# Patient Record
Sex: Female | Born: 1995
Health system: Southern US, Community
[De-identification: ages and names within clinical notes are randomized; demographics above are authoritative.]

## PROBLEM LIST (undated history)

## (undated) ENCOUNTER — Inpatient Hospital Stay (HOSPITAL_COMMUNITY): Payer: Self-pay

## (undated) DIAGNOSIS — K831 Obstruction of bile duct: Secondary | ICD-10-CM

## (undated) DIAGNOSIS — Z789 Other specified health status: Secondary | ICD-10-CM

## (undated) DIAGNOSIS — O26649 Intrahepatic cholestasis of pregnancy, unspecified trimester: Secondary | ICD-10-CM

## (undated) DIAGNOSIS — E282 Polycystic ovarian syndrome: Secondary | ICD-10-CM

## (undated) DIAGNOSIS — O26619 Liver and biliary tract disorders in pregnancy, unspecified trimester: Secondary | ICD-10-CM

## (undated) DIAGNOSIS — K5900 Constipation, unspecified: Secondary | ICD-10-CM

## (undated) HISTORY — DX: Intrahepatic cholestasis of pregnancy, unspecified trimester: O26.649

## (undated) HISTORY — DX: Polycystic ovarian syndrome: E28.2

## (undated) HISTORY — DX: Obstruction of bile duct: K83.1

## (undated) HISTORY — DX: Constipation, unspecified: K59.00

## (undated) HISTORY — DX: Liver and biliary tract disorders in pregnancy, unspecified trimester: O26.619

---
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
## 2013-10-13 HISTORY — PX: CHOLECYSTECTOMY: SHX55

## 2013-11-30 ENCOUNTER — Telehealth: Payer: Self-pay | Admitting: Family Medicine

## 2013-12-01 NOTE — Telephone Encounter (Signed)
Spoke with pt regarding appt Back pain is resolved Will call back if NTBS

## 2015-08-17 ENCOUNTER — Inpatient Hospital Stay (HOSPITAL_COMMUNITY)
Admission: AD | Admit: 2015-08-17 | Discharge: 2015-08-17 | Disposition: A | Discharge status: Home / Self Care | Payer: Medicaid Other | Source: Ambulatory Visit | Attending: Obstetrics and Gynecology | Admitting: Obstetrics and Gynecology

## 2015-08-17 ENCOUNTER — Other Ambulatory Visit: Payer: Self-pay | Admitting: Obstetrics and Gynecology

## 2015-08-17 ENCOUNTER — Encounter (HOSPITAL_COMMUNITY): Payer: Self-pay | Admitting: *Deleted

## 2015-08-17 DIAGNOSIS — O99612 Diseases of the digestive system complicating pregnancy, second trimester: Secondary | ICD-10-CM | POA: Diagnosis not present

## 2015-08-17 DIAGNOSIS — R0602 Shortness of breath: Secondary | ICD-10-CM | POA: Diagnosis not present

## 2015-08-17 DIAGNOSIS — R079 Chest pain, unspecified: Secondary | ICD-10-CM | POA: Diagnosis not present

## 2015-08-17 DIAGNOSIS — O26892 Other specified pregnancy related conditions, second trimester: Secondary | ICD-10-CM | POA: Diagnosis present

## 2015-08-17 DIAGNOSIS — Z3A24 24 weeks gestation of pregnancy: Secondary | ICD-10-CM | POA: Diagnosis not present

## 2015-08-17 DIAGNOSIS — K219 Gastro-esophageal reflux disease without esophagitis: Secondary | ICD-10-CM

## 2015-08-17 HISTORY — DX: Other specified health status: Z78.9

## 2015-08-17 NOTE — MAU Note (Signed)
L side chest pain for the past 1-2 weeks, also having SOB & dizziness, extremely tired, nauseated @ times.   Sent by MD office to MAU.  Pt states she has heavy discharge, no bleeding or LOF.

## 2015-08-17 NOTE — MAU Provider Note (Signed)
  History     CSN: [id number]  Arrival date and time: 08/17/15 1701   First Provider Initiated Contact with Patient 08/17/15 1809      Chief Complaint  Patient presents with  . Chest Pain  . Shortness of Breath   HPI [name] is a 19 y.o. G1P0 at 4869w6d who presents to MAU today with complaint of SOB. The patient states that this has been present x 1 week. She denies URI symptoms, chest pain, abdominal pain, vaginal bleeding or LOF.   OB History    Gravida Para Term Preterm AB TAB SAB Ectopic Multiple Living   1               Past Medical History  Diagnosis Date  . Medical history non-contributory     Past Surgical History  Procedure Laterality Date  . Cholecystectomy  2015    History reviewed. No pertinent family history.  Social History  Substance Use Topics  . Smoking status: Never Smoker   . Smokeless tobacco: None  . Alcohol Use: No    Allergies:  Allergies  Allergen Reactions  . Morphine And Related Itching    Prescriptions prior to admission  Medication Sig Dispense Refill Last Dose  . Prenatal Vit-Fe Fumarate-FA (PRENATAL MULTIVITAMIN) TABS tablet Take 1 tablet by mouth daily at 12 noon.   08/17/2015 at Unknown time    Review of Systems  Constitutional: Negative for fever and malaise/fatigue.  Respiratory: Positive for shortness of breath.   Cardiovascular: Negative for chest pain.  Gastrointestinal: Negative for abdominal pain.  Genitourinary:       Neg -vaginal bleeding, discharge, LOF   Physical Exam   Blood pressure 116/66, pulse 85, temperature 98.4 F (36.9 C), temperature source Oral, resp. rate 18, height 5\' 3"  (1.6 m), weight 185 lb (83.915 kg), SpO2 100 %.  Physical Exam  Nursing note and vitals reviewed. Constitutional: She is oriented to person, place, and time. She appears well-developed and well-nourished. No distress.  HENT:  Head: Normocephalic and atraumatic.  Cardiovascular: Normal rate, regular rhythm and normal  heart sounds.   Respiratory: Effort normal and breath sounds normal. No respiratory distress.  GI: Soft. She exhibits no distension and no mass. There is no tenderness. There is no rebound and no guarding.  Neurological: She is alert and oriented to person, place, and time.  Skin: Skin is warm and dry. No erythema.  Psychiatric: She has a normal mood and affect.    Fetal Monitoring: Baseline: 130 bpm, moderate variability, + accelerations, no decelerations Contractions: none  MAU Course  Procedures NOne  MDM EKG - Normal sinus rhythm Discussed patient with Dr. Henderson CloudHorvath. Ok for discharge at this time with GERD instructions. Patient may follow-up as scheduled or sooner.    Assessment and Plan  A: SIUP at 1469w6d SOB GERD  P: Discharge home GERD diet discussed Patient advised [facility] scheduled or sooner PRN Patient may return to MAU as needed or if her condition were to change or worsen   Marny LowensteinJulie N Wenzel, PA-C  08/17/2015, 6:09 PM

## 2015-08-17 NOTE — Discharge Instructions (Signed)
Food Choices for Gastroesophageal Reflux Disease, Adult  When you have gastroesophageal reflux disease (GERD), the foods you eat and your eating habits are very important. Choosing the right foods can help ease your discomfort.   WHAT GUIDELINES DO I NEED TO FOLLOW?   · Choose fruits, vegetables, whole grains, and low-fat dairy products.    · Choose low-fat meat, fish, and poultry.  · Limit fats such as oils, salad dressings, butter, nuts, and avocado.    · Keep a food diary. This helps you identify foods that cause symptoms.    · Avoid foods that cause symptoms. These may be different for everyone.    · Eat small meals often instead of 3 large meals a day.    · Eat your meals slowly, in a place where you are relaxed.    · Limit fried foods.    · Cook foods using methods other than frying.    · Avoid drinking alcohol.    · Avoid drinking large amounts of liquids with your meals.    · Avoid bending over or lying down until 2-3 hours after eating.    WHAT FOODS ARE NOT RECOMMENDED?   These are some foods and drinks that may make your symptoms worse:  Vegetables  Tomatoes. Tomato juice. Tomato and spaghetti sauce. Chili peppers. Onion and garlic. Horseradish.  Fruits  Oranges, grapefruit, and lemon (fruit and juice).  Meats  High-fat meats, fish, and poultry. This includes hot dogs, ribs, ham, sausage, salami, and bacon.  Dairy  Whole milk and chocolate milk. Sour cream. Cream. Butter. Ice cream. Cream cheese.   Drinks  Coffee and tea. Bubbly (carbonated) drinks or energy drinks.  Condiments  Hot sauce. Barbecue sauce.   Sweets/Desserts  Chocolate and cocoa. Donuts. Peppermint and spearmint.  Fats and Oils  High-fat foods. This includes French fries and potato chips.  Other  Vinegar. Strong spices. This includes black pepper, white pepper, red pepper, cayenne, curry powder, cloves, ginger, and chili powder.  The items listed above may not be a complete list of foods and drinks to avoid. Contact your dietitian for more  information.     This information is not intended to replace advice given to you by your health care provider. Make sure you discuss any questions you have with your health care provider.     Document Released: 03/30/2012 Document Revised: 10/20/2014 Document Reviewed: 08/03/2013  Elsevier Interactive Patient Education ©2016 Elsevier Inc.

## 2015-10-14 NOTE — L&D Delivery Note (Signed)
Delivery Note At 12:20 PM a viable female was delivered via  (Presentation vertex: ; LOA ).  APGAR:8 ,9 ; weight  .   Placenta status:spont , via shultz.  Cord:3vc  with the following complications:none .  Cord pH: n/a  Anesthesia: epidural  Episiotomy:  none Lacerations:  none Suture Repair: none Est. Blood Loss 100(mL):    Mom to postpartum.  Baby to Couplet care / Skin to Skin.  Samier Jaco DARLENE 12/02/2015, 12:30 PM

## 2015-11-01 LAB — OB RESULTS CONSOLE GBS: STREP GROUP B AG: NEGATIVE

## 2015-11-11 ENCOUNTER — Encounter (HOSPITAL_COMMUNITY): Payer: Self-pay

## 2015-11-11 ENCOUNTER — Inpatient Hospital Stay (HOSPITAL_COMMUNITY)
Admission: AD | Admit: 2015-11-11 | Discharge: 2015-11-11 | Disposition: A | Discharge status: Home / Self Care | Payer: Medicaid Other | Source: Ambulatory Visit | Attending: Obstetrics and Gynecology | Admitting: Obstetrics and Gynecology

## 2015-11-11 DIAGNOSIS — Z3493 Encounter for supervision of normal pregnancy, unspecified, third trimester: Secondary | ICD-10-CM | POA: Diagnosis not present

## 2015-11-11 NOTE — Progress Notes (Signed)
May D/C home 

## 2015-11-11 NOTE — MAU Note (Signed)
Pt states contractions that started around 630p and are every 5 mins apart. Denies LOF, or vag bleeding. +FM. Was 4cm at last exam in office.

## 2015-11-19 ENCOUNTER — Ambulatory Visit (INDEPENDENT_AMBULATORY_CARE_PROVIDER_SITE_OTHER): Payer: Medicaid Other | Admitting: Family Medicine

## 2015-11-19 ENCOUNTER — Encounter: Payer: Self-pay | Admitting: Family Medicine

## 2015-11-19 VITALS — BP 126/83 | HR 106 | Temp 98.3°F | Ht 63.0 in | Wt 209.4 lb

## 2015-11-19 DIAGNOSIS — H66002 Acute suppurative otitis media without spontaneous rupture of ear drum, left ear: Secondary | ICD-10-CM

## 2015-11-19 DIAGNOSIS — R52 Pain, unspecified: Secondary | ICD-10-CM | POA: Diagnosis not present

## 2015-11-19 DIAGNOSIS — R059 Cough, unspecified: Secondary | ICD-10-CM

## 2015-11-19 DIAGNOSIS — J029 Acute pharyngitis, unspecified: Secondary | ICD-10-CM

## 2015-11-19 DIAGNOSIS — R05 Cough: Secondary | ICD-10-CM

## 2015-11-19 LAB — POCT INFLUENZA A/B
INFLUENZA A, POC: NEGATIVE
Influenza B, POC: NEGATIVE

## 2015-11-19 LAB — POCT RAPID STREP A (OFFICE): Rapid Strep A Screen: NEGATIVE

## 2015-11-19 MED ORDER — NYSTATIN 100000 UNIT/GM EX CREA: 1.0000 "application " | TOPICAL_CREAM | Freq: Two times a day (BID) | CUTANEOUS | Status: DC

## 2015-11-19 MED ORDER — AMOXICILLIN 500 MG PO CAPS: 500.0000 mg | ORAL_CAPSULE | Freq: Two times a day (BID) | ORAL | Status: DC

## 2015-11-19 NOTE — Progress Notes (Signed)
   HPI  Patient presents today here to establish care and discuss an acute illness.  Patient is [redacted] weeks pregnant and goes to [facility]  She describes 3 days of body aches, cough, nausea, nasal congestion, and sore throat. She also has left-sided ear pain with muffled hearing. She also has body aches, chills, and has had a few episodes of diarrhea. She's also had nausea, with one episode of post tussive emesis this morning, she's had 2-3 episodes of loose stools today. She is GBS negative.  She has normal fetal movement, no vaginal bleeding or discharge, and no contractions currently. She has had one or 2 irregular contractions in the last 2 days. She denies headache.  She also complains of intense itching of the bilateral palms and soles over the last week or so.   PMH: Smoking status noted Past medical, surgical, social, family history reviewed and updated in EMR ROS: Per HPI  Objective: BP 126/83 mmHg  Pulse 106  Temp(Src) 98.3 F (36.8 C) (Oral)  Ht  (1.6 m)  Wt 209 lb 6.4 oz (94.983 kg)  BMI 37.10 kg/m2 Gen: NAD, alert, cooperative with exam HEENT: NCAT CV: RRR, good S1/S2, no murmur Resp: CTABL, no wheezes, non-labored Abd: gravid uterus, skin with mild intertrigo under abdominal fold Ext: No edema, warm Neuro: Alert and oriented, No gross deficits  Assessment and plan:  # Acute suppurative otitis media Treat with amoxil, superimposed on viral illness  # Intertrigo Treat with nystatin  # Itching of the palms and soles Concerning for cholestasis of pregnancy, no jaundice or icterus Abd with rash of the skin fold that is consistent with intertrigo Discussed with her GYN, Dr. Claiborne Billings who offered labs tomorrow or on Wednesday when she comes in for routine visit Discussed kick counts   Orders Placed This Encounter  Procedures  . Culture, Group A Strep    Order Specific Question:  Source    Answer:  throat  . POCT rapid strep A  . POCT  Influenza A/B     Murtis Sink, MD [facility] 11/19/2015, 3:20 PM

## 2015-11-19 NOTE — Patient Instructions (Signed)
Great ti see you!  We will talk to your GYN doctor and I will talk with you when you come back with your brother.   Otitis Media With Effusion Otitis media with effusion is the presence of fluid in the middle ear. This is a common problem in children, which often follows ear infections. It may be present for weeks or longer after the infection. Unlike an acute ear infection, otitis media with effusion refers only to fluid behind the ear drum and not infection. Children with repeated ear and sinus infections and allergy problems are the most likely to get otitis media with effusion. CAUSES  The most frequent cause of the fluid buildup is dysfunction of the eustachian tubes. These are the tubes that drain fluid in the ears to the back of the nose (nasopharynx). SYMPTOMS   The main symptom of this condition is hearing loss. As a result, you or your child may:  Listen to the TV at a loud volume.  Not respond to questions.  Ask "what" often when spoken to.  Mistake or confuse one sound or word for another.  There may be a sensation of fullness or pressure but usually not pain. DIAGNOSIS   Your health care provider will diagnose this condition by examining you or your child's ears.  Your health care provider may test the pressure in you or your child's ear with a tympanometer.  A hearing test may be conducted if the problem persists. TREATMENT   Treatment depends on the duration and the effects of the effusion.  Antibiotics, decongestants, nose drops, and cortisone-type drugs (tablets or nasal spray) may not be helpful.  Children with persistent ear effusions may have delayed language or behavioral problems. Children at risk for developmental delays in hearing, learning, and speech may require referral to a specialist earlier than children not at risk.  You or your child's health care provider may suggest a referral to an ear, nose, and throat surgeon for treatment. The following may  help restore normal hearing:  Drainage of fluid.  Placement of ear tubes (tympanostomy tubes).  Removal of adenoids (adenoidectomy). HOME CARE INSTRUCTIONS   Avoid secondhand smoke.  Infants who are breastfed are less likely to have this condition.  Avoid feeding infants while they are lying flat.  Avoid known environmental allergens.  Avoid people who are sick. SEEK MEDICAL CARE IF:   Hearing is not better in 3 months.  Hearing is worse.  Ear pain.  Drainage from the ear.  Dizziness. MAKE SURE YOU:   Understand these instructions.  Will watch your condition.  Will get help right away if you are not doing well or get worse.   This information is not intended to replace advice given to you by your health care provider. Make sure you discuss any questions you have with your health care provider.   Document Released: 11/06/2004 Document Revised: 10/20/2014 Document Reviewed: 04/26/2013 Elsevier Interactive Patient Education [employer]

## 2015-11-20 ENCOUNTER — Telehealth: Payer: Self-pay | Admitting: Family Medicine

## 2015-11-20 ENCOUNTER — Ambulatory Visit: Payer: Medicaid Other | Admitting: Family Medicine

## 2015-11-20 ENCOUNTER — Inpatient Hospital Stay (HOSPITAL_COMMUNITY)
Admission: AD | Admit: 2015-11-20 | Discharge: 2015-11-20 | Disposition: A | Discharge status: Home / Self Care | Payer: Medicaid Other | Source: Ambulatory Visit | Attending: Obstetrics and Gynecology | Admitting: Obstetrics and Gynecology

## 2015-11-20 ENCOUNTER — Encounter (HOSPITAL_COMMUNITY): Payer: Self-pay

## 2015-11-20 DIAGNOSIS — Z9049 Acquired absence of other specified parts of digestive tract: Secondary | ICD-10-CM | POA: Diagnosis not present

## 2015-11-20 DIAGNOSIS — R509 Fever, unspecified: Secondary | ICD-10-CM | POA: Diagnosis present

## 2015-11-20 DIAGNOSIS — O98813 Other maternal infectious and parasitic diseases complicating pregnancy, third trimester: Secondary | ICD-10-CM | POA: Diagnosis not present

## 2015-11-20 DIAGNOSIS — J101 Influenza due to other identified influenza virus with other respiratory manifestations: Secondary | ICD-10-CM | POA: Diagnosis not present

## 2015-11-20 DIAGNOSIS — K219 Gastro-esophageal reflux disease without esophagitis: Secondary | ICD-10-CM | POA: Diagnosis not present

## 2015-11-20 DIAGNOSIS — Z3A38 38 weeks gestation of pregnancy: Secondary | ICD-10-CM | POA: Diagnosis not present

## 2015-11-20 DIAGNOSIS — J111 Influenza due to unidentified influenza virus with other respiratory manifestations: Secondary | ICD-10-CM | POA: Diagnosis not present

## 2015-11-20 DIAGNOSIS — O9989 Other specified diseases and conditions complicating pregnancy, childbirth and the puerperium: Secondary | ICD-10-CM

## 2015-11-20 DIAGNOSIS — J029 Acute pharyngitis, unspecified: Secondary | ICD-10-CM | POA: Diagnosis present

## 2015-11-20 LAB — URINE MICROSCOPIC-ADD ON

## 2015-11-20 LAB — URINALYSIS, ROUTINE W REFLEX MICROSCOPIC
Bilirubin Urine: NEGATIVE
Glucose, UA: NEGATIVE mg/dL
Ketones, ur: NEGATIVE mg/dL
Nitrite: NEGATIVE
Protein, ur: NEGATIVE mg/dL
Specific Gravity, Urine: <1.005 — ABNORMAL LOW (ref 1.005–1.030)
pH: 6.5 (ref 5.0–8.0)

## 2015-11-20 LAB — INFLUENZA PANEL BY PCR (TYPE A & B)
H1N1 flu by pcr: NOT DETECTED
Influenza A By PCR: POSITIVE — AB
Influenza B By PCR: NEGATIVE

## 2015-11-20 MED ORDER — PROMETHAZINE HCL 25 MG/ML IJ SOLN
25.0000 mg | Freq: Once | INTRAMUSCULAR | Status: AC
  Administered 2015-11-20: 25 mg via INTRAVENOUS
  Filled 2015-11-20: qty 1

## 2015-11-20 MED ORDER — OSELTAMIVIR PHOSPHATE 75 MG PO CAPS: 75.0000 mg | ORAL_CAPSULE | Freq: Two times a day (BID) | ORAL | Status: AC

## 2015-11-20 MED ORDER — LACTATED RINGERS IV BOLUS (SEPSIS)
1000.0000 mL | Freq: Once | INTRAVENOUS | Status: AC
  Administered 2015-11-20: 1000 mL via INTRAVENOUS

## 2015-11-20 MED ORDER — ACETAMINOPHEN 325 MG PO TABS
650.0000 mg | ORAL_TABLET | Freq: Four times a day (QID) | ORAL | Status: DC | PRN
  Administered 2015-11-20: 650 mg via ORAL
  Filled 2015-11-20: qty 2

## 2015-11-20 MED ORDER — BENZONATATE 100 MG PO CAPS: 100.0000 mg | ORAL_CAPSULE | Freq: Three times a day (TID) | ORAL | Status: DC | PRN

## 2015-11-20 NOTE — Telephone Encounter (Signed)
Pt seen yesterday for illness Fever started last PM Pt wants to be rechecked appt scheduled

## 2015-11-20 NOTE — MAU Provider Note (Signed)
History     CSN: [id number]  Arrival date and time: 11/20/15 1128     Chief Complaint  Patient presents with  . Sore Throat  . Fever  . Diarrhea  . Nasal Congestion   HPI  Christine Alexander is a 20 yo G1 at [redacted]w[redacted]d presenting with rhinorrhea, sore throat, diarrhea, cough and fever. She reports body aches, rhinorrhea, sore throat, chills at night, throbbing frontal HA for the past 4 days. Has been treating herself with Tylenol and Robitussin which initially helped but has not been helping for the day or so. She was seen by [facility] 2/6 where she was diagnosed with suppurative otitis media and was given amoxicillin. Additionally her rapid strep and flu were negative. Overnight, symptoms worsened. She also reports productive yellow-green sputum with post-tussive gagging but no emesis, increased shortness of breath since yesterday, mid chest pain with inspiration. Notes of watery diarrhea for 1 day; no blood in diarrhea. She reported of fever of 101F this AM; she called her PCP who encouraged her to been seen. Patient reports sick contact; her brother who was recently diagnosed with PNA and her other brother with symptoms of URI with cough and fever. Patient reports decreased appetite since symptoms worsened. She has been drinking water, but not able to each much solids she has the sensation that the food comes back up; she attributes this to her GERD.   She reports no contractions, no VB, no LOF. Endorses fetal movement. Reports no complications with current pregnancy  Past Medical History  Diagnosis Date  . Medical history non-contributory     Past Surgical History  Procedure Laterality Date  . Cholecystectomy  2015    Family History  Problem Relation Age of Onset  . Hypertension Father     Social History  Substance Use Topics  . Smoking status: Never Smoker   . Smokeless tobacco: None  . Alcohol Use: No    Allergies:  Allergies  Allergen Reactions  . Morphine And Related Itching     Prescriptions prior to admission  Medication Sig Dispense Refill Last Dose  . amoxicillin (AMOXIL) 500 MG capsule Take 1 capsule (500 mg total) by mouth 2 (two) times daily. 20 capsule 0   . nystatin cream (MYCOSTATIN) Apply 1 application topically 2 (two) times daily. 30 g 0   . Prenatal Vit-Fe Fumarate-FA (PRENATAL MULTIVITAMIN) TABS tablet Take 1 tablet by mouth daily at 12 noon.   Taking    Review of Systems  Constitutional: Positive for fever, chills and malaise/fatigue.  HENT: Positive for congestion, ear pain and sore throat. Negative for ear discharge.   Respiratory: Positive for cough, sputum production and shortness of breath. Negative for wheezing.   Cardiovascular: Negative for chest pain and palpitations.  Gastrointestinal: Positive for nausea, vomiting, abdominal pain and diarrhea.  Genitourinary: Negative for dysuria and urgency.  Neurological: Positive for headaches.   Physical Exam   Blood pressure 125/74, pulse 102, temperature 98.3 F (36.8 C), temperature source Oral, height  (1.6 m), weight 94.348 kg (208 lb), SpO2 99 %.  Physical Exam  Constitutional: She appears well-developed and well-nourished. No distress.  HENT:  Head: Normocephalic and atraumatic.  Right Ear: External ear normal. Tympanic membrane is not erythematous, not retracted and not bulging.  Left Ear: External ear normal. Tympanic membrane is not erythematous, not retracted and not bulging.  Nose: Rhinorrhea (Turbinates erythematous and edematous) present. Right sinus exhibits maxillary sinus tenderness and frontal sinus tenderness. Left sinus exhibits maxillary sinus  tenderness and frontal sinus tenderness.  Mouth/Throat: Posterior oropharyngeal edema and posterior oropharyngeal erythema present. No oropharyngeal exudate.  Eyes: Pupils are equal, round, and reactive to light.  Cardiovascular: Regular rhythm, S1 normal and S2 normal.  Tachycardia present.   Respiratory: Effort normal and  breath sounds normal. She has no decreased breath sounds. She has no wheezes. She has no rhonchi. She has no rales. She exhibits no tenderness.  GI: Soft. Normal appearance and bowel sounds are normal. There is no tenderness.  Lymphadenopathy:       Head (right side): Submandibular (Tenderness to palpation) adenopathy present. No tonsillar, no preauricular and no posterior auricular adenopathy present.       Head (left side): No tonsillar, no preauricular and no posterior auricular adenopathy present.       Right cervical: No superficial cervical, no deep cervical and no posterior cervical adenopathy present.      Left cervical: No superficial cervical, no deep cervical and no posterior cervical adenopathy present.  Skin: Skin is warm and dry.  Psychiatric: She has a normal mood and affect.   Results for orders placed or performed during the hospital encounter of 11/20/15 (from the past 24 hour(s))  Urinalysis, Routine w reflex microscopic (not at [facility])     Status: Abnormal   Collection Time: 11/20/15 11:45 AM  Result Value Ref Range   Color, Urine YELLOW YELLOW   APPearance CLEAR CLEAR   Specific Gravity, Urine <1.005 (L) 1.005 - 1.030   pH 6.5 5.0 - 8.0   Glucose, UA NEGATIVE NEGATIVE mg/dL   Hgb urine dipstick TRACE (A) NEGATIVE   Bilirubin Urine NEGATIVE NEGATIVE   Ketones, ur NEGATIVE NEGATIVE mg/dL   Protein, ur NEGATIVE NEGATIVE mg/dL   Nitrite NEGATIVE NEGATIVE   Leukocytes, UA SMALL (A) NEGATIVE  Urine microscopic-add on     Status: Abnormal   Collection Time: 11/20/15 11:45 AM  Result Value Ref Range   Squamous Epithelial / LPF 0-5 (A) NONE SEEN   WBC, UA 0-5 0 - 5 WBC/hpf   RBC / HPF 0-5 0 - 5 RBC/hpf   Bacteria, UA FEW (A) NONE SEEN   Positive for influenza Type A MAU Course  Procedures  MDM Pt will be treated with Tamiflu on outpatient basis. Dr Dareen Piano aware of POC. FHR Cat1 and occasional contractions  Assessment and Plan  Influenza Tamiflu  BID x 5  days Tessalon Perls   Discharge    Palma Holter 11/20/2015, 1:55 PM

## 2015-11-20 NOTE — Discharge Instructions (Signed)
Influenza, Adult °Influenza ("the flu") is a viral infection of the respiratory tract. It occurs more often in winter months because people spend more time in close contact with one another. Influenza can make you feel very sick. Influenza easily spreads from person to person (contagious). °CAUSES  °Influenza is caused by a virus that infects the respiratory tract. You can catch the virus by breathing in droplets from an infected person's cough or sneeze. You can also catch the virus by touching something that was recently contaminated with the virus and then touching your mouth, nose, or eyes. °RISKS AND COMPLICATIONS °You may be at risk for a more severe case of influenza if you smoke cigarettes, have diabetes, have chronic heart disease (such as heart failure) or lung disease (such as asthma), or if you have a weakened immune system. Elderly people and pregnant women are also at risk for more serious infections. The most common problem of influenza is a lung infection (pneumonia). Sometimes, this problem can require emergency medical care and may be life threatening. °SIGNS AND SYMPTOMS  °Symptoms typically last 4 to 10 days and may include: °· Fever. °· Chills. °· Headache, body aches, and muscle aches. °· Sore throat. °· Chest discomfort and cough. °· Poor appetite. °· Weakness or feeling tired. °· Dizziness. °· Nausea or vomiting. °DIAGNOSIS  °Diagnosis of influenza is often made based on your history and a physical exam. A nose or throat swab test can be done to confirm the diagnosis. °TREATMENT  °In mild cases, influenza goes away on its own. Treatment is directed at relieving symptoms. For more severe cases, your health care provider may prescribe antiviral medicines to shorten the sickness. Antibiotic medicines are not effective because the infection is caused by a virus, not by bacteria. °HOME CARE INSTRUCTIONS °· Take medicines only as directed by your health care provider. °· Use a cool mist humidifier  to make breathing easier. °· Get plenty of rest until your temperature returns to normal. This usually takes 3 to 4 days. °· Drink enough fluid to keep your urine clear or pale yellow. °· Cover your mouth and nose when coughing or sneezing, and wash your hands well to prevent the virus from spreading. °· Stay home from work or school until the fever is gone for at least 1 full day. °PREVENTION  °An annual influenza vaccination (flu shot) is the best way to avoid getting influenza. An annual flu shot is now routinely recommended for all adults in the [place] °SEEK MEDICAL CARE IF: °· You experience chest pain, your cough worsens, or you produce more mucus. °· You have nausea, vomiting, or diarrhea. °· Your fever returns or gets worse. °SEEK IMMEDIATE MEDICAL CARE IF: °· You have trouble breathing, you become short of breath, or your skin or nails become bluish. °· You have severe pain or stiffness in the neck. °· You develop a sudden headache, or pain in the face or ear. °· You have nausea or vomiting that you cannot control. °MAKE SURE YOU:  °· Understand these instructions. °· Will watch your condition. °· Will get help right away if you are not doing well or get worse. °  °This information is not intended to replace advice given to you by your health care provider. Make sure you discuss any questions you have with your health care provider. °  °Document Released: 09/26/2000 Document Revised: 10/20/2014 Document Reviewed: 12/29/2011 °Elsevier Interactive Patient Education ©2016 Elsevier Inc. ° °Cough, Adult °A cough helps to clear your throat and lungs. A cough may last only 2-3 weeks (acute), or it may last longer than 8 weeks (chronic). Many different things can cause a   cough. A cough may be a sign of an illness or another medical condition. °HOME CARE °· Pay attention to any changes in your cough. °· Take medicines only as told by your doctor. °¨ If you were prescribed an antibiotic medicine, take it as told by  your doctor. Do not stop taking it even if you start to feel better. °¨ Talk with your doctor before you try using a cough medicine. °· Drink enough fluid to keep your pee (urine) clear or pale yellow. °· If the air is dry, use a cold steam vaporizer or humidifier in your home. °· Stay away from things that make you cough at work or at home. °· If your cough is worse at night, try using extra pillows to raise your head up higher while you sleep. °· Do not smoke, and try not to be around smoke. If you need help quitting, ask your doctor. °· Do not have caffeine. °· Do not drink alcohol. °· Rest as needed. °GET HELP IF: °· You have new problems (symptoms). °· You cough up yellow fluid (pus). °· Your cough does not get better after 2-3 weeks, or your cough gets worse. °· Medicine does not help your cough and you are not sleeping well. °· You have pain that gets worse or pain that is not helped with medicine. °· You have a fever. °· You are losing weight and you do not know why. °· You have night sweats. °GET HELP RIGHT AWAY IF: °· You cough up blood. °· You have trouble breathing. °· Your heartbeat is very fast. °  °This information is not intended to replace advice given to you by your health care provider. Make sure you discuss any questions you have with your health care provider. °  °Document Released: 06/12/2011 Document Revised: 06/20/2015 Document Reviewed: 12/06/2014 °Elsevier Interactive Patient Education ©2016 Elsevier Inc. ° °

## 2015-11-20 NOTE — Telephone Encounter (Signed)
Called and discussed   She had a sharp decline since leaving yesterday. She states she's had fever of 101 off and on throughout the night, severe body aches, and chills. Her breathing has become labored.   She states she is so weak she can barely walk.  She declines calling an ambulance but agrees to get a ride to teh MAU at [facility] whom I've just called and explained she is coming.   She is 38 weeks, kick counts overnight have been normal but she is now worried about subjective decreased fetal movement.   She has itching of the palms and soles concerning for Cholestasis of pregnancy. She had no RUQ pain, jaundice, or icterus on my exam 1 daya go.   She has a f/u at [facility] tomorrow.    She was flu negative yesterday but I am concerned for interval development of pneumonia or a false negative flue test.    Fever of 101, Body aches, chills.

## 2015-11-20 NOTE — MAU Note (Signed)
Started 3 or 4 days ago with sore throat, headache, congestion.    Neg flu test yesterday at [facility].  Running fever 100-101.  Was up all night with diarrhea.  Her brother currently has pneumonia.  Is on an antibiotic for an ear infection-started yesterday.

## 2015-11-21 LAB — CULTURE, GROUP A STREP: STREP A CULTURE: NEGATIVE

## 2015-12-01 ENCOUNTER — Inpatient Hospital Stay (HOSPITAL_COMMUNITY)
Admission: AD | Admit: 2015-12-01 | Discharge: 2015-12-04 | DRG: 775 | Disposition: A | Discharge status: Home / Self Care | Payer: Medicaid Other | Source: Ambulatory Visit | Attending: Obstetrics and Gynecology | Admitting: Obstetrics and Gynecology

## 2015-12-01 DIAGNOSIS — Z6791 Unspecified blood type, Rh negative: Secondary | ICD-10-CM

## 2015-12-01 DIAGNOSIS — Z3A40 40 weeks gestation of pregnancy: Secondary | ICD-10-CM | POA: Diagnosis not present

## 2015-12-01 DIAGNOSIS — O26893 Other specified pregnancy related conditions, third trimester: Secondary | ICD-10-CM | POA: Diagnosis present

## 2015-12-01 DIAGNOSIS — Z3490 Encounter for supervision of normal pregnancy, unspecified, unspecified trimester: Secondary | ICD-10-CM

## 2015-12-01 NOTE — MAU Note (Signed)
Contractions since 1800. Some bloody show. Denies LOF

## 2015-12-02 ENCOUNTER — Encounter (HOSPITAL_COMMUNITY): Payer: Self-pay

## 2015-12-02 ENCOUNTER — Inpatient Hospital Stay (HOSPITAL_COMMUNITY): Payer: Medicaid Other | Admitting: Anesthesiology

## 2015-12-02 DIAGNOSIS — Z6791 Unspecified blood type, Rh negative: Secondary | ICD-10-CM

## 2015-12-02 DIAGNOSIS — Z3A40 40 weeks gestation of pregnancy: Secondary | ICD-10-CM

## 2015-12-02 DIAGNOSIS — O26893 Other specified pregnancy related conditions, third trimester: Secondary | ICD-10-CM

## 2015-12-02 DIAGNOSIS — Z3490 Encounter for supervision of normal pregnancy, unspecified, unspecified trimester: Secondary | ICD-10-CM

## 2015-12-02 LAB — CBC
HEMATOCRIT: 37.4 % (ref 36.0–46.0)
Hemoglobin: 12.9 g/dL (ref 12.0–15.0)
MCH: 29.1 pg (ref 26.0–34.0)
MCHC: 34.5 g/dL (ref 30.0–36.0)
MCV: 84.2 fL (ref 78.0–100.0)
Platelets: 228 10*3/uL (ref 150–400)
RBC: 4.44 MIL/uL (ref 3.87–5.11)
RDW: 14 % (ref 11.5–15.5)
WBC: 13.3 10*3/uL — AB (ref 4.0–10.5)

## 2015-12-02 LAB — RPR: RPR Ser Ql: NONREACTIVE

## 2015-12-02 LAB — TYPE AND SCREEN
ABO/RH(D): O NEG
ANTIBODY SCREEN: NEGATIVE

## 2015-12-02 LAB — ABO/RH: ABO/RH(D): O NEG

## 2015-12-02 MED ORDER — DIPHENHYDRAMINE HCL 50 MG/ML IJ SOLN: 12.5000 mg | INTRAMUSCULAR | Status: DC | PRN

## 2015-12-02 MED ORDER — TETANUS-DIPHTH-ACELL PERTUSSIS 5-2.5-18.5 LF-MCG/0.5 IM SUSP: 0.5000 mL | Freq: Once | INTRAMUSCULAR | Status: DC

## 2015-12-02 MED ORDER — ACETAMINOPHEN 325 MG PO TABS
650.0000 mg | ORAL_TABLET | ORAL | Status: DC | PRN
  Administered 2015-12-02: 650 mg via ORAL
  Filled 2015-12-02: qty 2

## 2015-12-02 MED ORDER — OXYTOCIN BOLUS FROM INFUSION
500.0000 mL | INTRAVENOUS | Status: DC
  Administered 2015-12-02: 500 mL via INTRAVENOUS

## 2015-12-02 MED ORDER — SIMETHICONE 80 MG PO CHEW: 80.0000 mg | CHEWABLE_TABLET | ORAL | Status: DC | PRN

## 2015-12-02 MED ORDER — DIPHENHYDRAMINE HCL 25 MG PO CAPS: 25.0000 mg | ORAL_CAPSULE | Freq: Four times a day (QID) | ORAL | Status: DC | PRN

## 2015-12-02 MED ORDER — LANOLIN HYDROUS EX OINT: TOPICAL_OINTMENT | CUTANEOUS | Status: DC | PRN

## 2015-12-02 MED ORDER — LACTATED RINGERS IV SOLN
500.0000 mL | INTRAVENOUS | Status: DC | PRN
  Administered 2015-12-02: 1000 mL via INTRAVENOUS

## 2015-12-02 MED ORDER — EPHEDRINE 5 MG/ML INJ: 10.0000 mg | INTRAVENOUS | Status: DC | PRN

## 2015-12-02 MED ORDER — FLEET ENEMA 7-19 GM/118ML RE ENEM: 1.0000 | ENEMA | RECTAL | Status: DC | PRN

## 2015-12-02 MED ORDER — FENTANYL 2.5 MCG/ML BUPIVACAINE 1/10 % EPIDURAL INFUSION (WH - ANES)
14.0000 mL/h | INTRAMUSCULAR | Status: DC | PRN
  Administered 2015-12-02: 14 mL/h via EPIDURAL
  Filled 2015-12-02: qty 125

## 2015-12-02 MED ORDER — LACTATED RINGERS IV SOLN: 500.0000 mL | Freq: Once | INTRAVENOUS | Status: AC

## 2015-12-02 MED ORDER — LIDOCAINE HCL (PF) 1 % IJ SOLN
30.0000 mL | INTRAMUSCULAR | Status: DC | PRN
  Filled 2015-12-02: qty 30

## 2015-12-02 MED ORDER — BENZOCAINE-MENTHOL 20-0.5 % EX AERO
1.0000 "application " | INHALATION_SPRAY | CUTANEOUS | Status: DC | PRN
  Administered 2015-12-02: 1 via TOPICAL
  Filled 2015-12-02: qty 56

## 2015-12-02 MED ORDER — LIDOCAINE HCL (PF) 1 % IJ SOLN
INTRAMUSCULAR | Status: DC | PRN
  Administered 2015-12-02: 4 mL
  Administered 2015-12-02: 6 mL

## 2015-12-02 MED ORDER — EPHEDRINE 5 MG/ML INJ
10.0000 mg | INTRAVENOUS | Status: DC | PRN
  Filled 2015-12-02: qty 2

## 2015-12-02 MED ORDER — IBUPROFEN 600 MG PO TABS
600.0000 mg | ORAL_TABLET | Freq: Four times a day (QID) | ORAL | Status: DC
  Administered 2015-12-03 – 2015-12-04 (×7): 600 mg via ORAL
  Filled 2015-12-02 (×8): qty 1

## 2015-12-02 MED ORDER — CITRIC ACID-SODIUM CITRATE 334-500 MG/5ML PO SOLN: 30.0000 mL | ORAL | Status: DC | PRN

## 2015-12-02 MED ORDER — PHENYLEPHRINE 40 MCG/ML (10ML) SYRINGE FOR IV PUSH (FOR BLOOD PRESSURE SUPPORT)
80.0000 ug | PREFILLED_SYRINGE | INTRAVENOUS | Status: DC | PRN
  Filled 2015-12-02: qty 2
  Filled 2015-12-02: qty 20

## 2015-12-02 MED ORDER — ONDANSETRON HCL 4 MG PO TABS: 4.0000 mg | ORAL_TABLET | ORAL | Status: DC | PRN

## 2015-12-02 MED ORDER — LACTATED RINGERS IV SOLN: 500.0000 mL | Freq: Once | INTRAVENOUS | Status: DC

## 2015-12-02 MED ORDER — PRENATAL MULTIVITAMIN CH
1.0000 | ORAL_TABLET | Freq: Every day | ORAL | Status: DC
  Administered 2015-12-03 – 2015-12-04 (×2): 1 via ORAL
  Filled 2015-12-02 (×2): qty 1

## 2015-12-02 MED ORDER — ACETAMINOPHEN 325 MG PO TABS: 650.0000 mg | ORAL_TABLET | ORAL | Status: DC | PRN

## 2015-12-02 MED ORDER — ONDANSETRON HCL 4 MG/2ML IJ SOLN: 4.0000 mg | Freq: Four times a day (QID) | INTRAMUSCULAR | Status: DC | PRN

## 2015-12-02 MED ORDER — WITCH HAZEL-GLYCERIN EX PADS: 1.0000 "application " | MEDICATED_PAD | CUTANEOUS | Status: DC | PRN

## 2015-12-02 MED ORDER — OXYTOCIN 10 UNIT/ML IJ SOLN
2.5000 [IU]/h | INTRAVENOUS | Status: DC
  Filled 2015-12-02: qty 4

## 2015-12-02 MED ORDER — FENTANYL 2.5 MCG/ML BUPIVACAINE 1/10 % EPIDURAL INFUSION (WH - ANES): 14.0000 mL/h | INTRAMUSCULAR | Status: DC | PRN

## 2015-12-02 MED ORDER — PHENYLEPHRINE 40 MCG/ML (10ML) SYRINGE FOR IV PUSH (FOR BLOOD PRESSURE SUPPORT)
80.0000 ug | PREFILLED_SYRINGE | INTRAVENOUS | Status: DC | PRN
Start: 2015-12-02 — End: 2015-12-02
  Filled 2015-12-02: qty 2

## 2015-12-02 MED ORDER — DIBUCAINE 1 % RE OINT: 1.0000 "application " | TOPICAL_OINTMENT | RECTAL | Status: DC | PRN

## 2015-12-02 MED ORDER — ONDANSETRON HCL 4 MG/2ML IJ SOLN: 4.0000 mg | INTRAMUSCULAR | Status: DC | PRN

## 2015-12-02 MED ORDER — LACTATED RINGERS IV SOLN
INTRAVENOUS | Status: DC
  Administered 2015-12-02: 01:00:00 via INTRAVENOUS

## 2015-12-02 MED ORDER — SENNOSIDES-DOCUSATE SODIUM 8.6-50 MG PO TABS
2.0000 | ORAL_TABLET | ORAL | Status: DC
  Administered 2015-12-03 (×2): 2 via ORAL
  Filled 2015-12-02 (×2): qty 2

## 2015-12-02 MED ORDER — PHENYLEPHRINE 40 MCG/ML (10ML) SYRINGE FOR IV PUSH (FOR BLOOD PRESSURE SUPPORT): 80.0000 ug | PREFILLED_SYRINGE | INTRAVENOUS | Status: DC | PRN

## 2015-12-02 MED ORDER — EPHEDRINE 5 MG/ML INJ
10.0000 mg | INTRAVENOUS | Status: DC | PRN
Start: 2015-12-02 — End: 2015-12-02
  Filled 2015-12-02: qty 2

## 2015-12-02 MED ORDER — ZOLPIDEM TARTRATE 5 MG PO TABS: 5.0000 mg | ORAL_TABLET | Freq: Every evening | ORAL | Status: DC | PRN

## 2015-12-02 NOTE — Anesthesia Preprocedure Evaluation (Addendum)
Anesthesia Evaluation  Patient identified by MRN, date of birth, ID band Patient awake    Reviewed: Allergy & Precautions, NPO status , Patient's Chart, lab work & pertinent test results  Airway Mallampati: II  TM Distance: >3 FB Neck ROM: Full    Dental no notable dental hx.    Pulmonary neg pulmonary ROS,    Pulmonary exam normal breath sounds clear to auscultation       Cardiovascular negative cardio ROS Normal cardiovascular exam Rhythm:Regular Rate:Normal     Neuro/Psych negative neurological ROS  negative psych ROS   GI/Hepatic negative GI ROS, Neg liver ROS,   Endo/Other  negative endocrine ROS  Renal/GU negative Renal ROS  negative genitourinary   Musculoskeletal negative musculoskeletal ROS (+)   Abdominal (+) + obese,   Peds negative pediatric ROS (+)  Hematology negative hematology ROS (+)   Anesthesia Other Findings   Reproductive/Obstetrics negative OB ROS                             Anesthesia Physical Anesthesia Plan  ASA: II  Anesthesia Plan: Spinal   Post-op Pain Management:    Induction: Intravenous  Airway Management Planned: Natural Airway  Additional Equipment:   Intra-op Plan:   Post-operative Plan:   Informed Consent: I have reviewed the patients History and Physical, chart, labs and discussed the procedure including the risks, benefits and alternatives for the proposed anesthesia with the patient or authorized representative who has indicated his/her understanding and acceptance.     Plan Discussed with: CRNA  Anesthesia Plan Comments: (Informed consent obtained prior to proceeding including risk of failure, 1% risk of PDPH, risk of minor discomfort and bruising.  Discussed rare but serious complications including epidural abscess, permanent nerve injury, epidural hematoma.  Discussed alternatives to epidural analgesia and patient desires to  proceed.  Timeout performed pre-procedure verifying patient name, procedure, and platelet count.  Patient tolerated procedure well. )       Anesthesia Quick Evaluation

## 2015-12-02 NOTE — Anesthesia Procedure Notes (Signed)
Epidural Patient location during procedure: OB  Staffing Anesthesiologist: Sherrian Divers  Preanesthetic Checklist Completed: patient identified, site marked, surgical consent, pre-op evaluation, timeout performed, IV checked, risks and benefits discussed and monitors and equipment checked  Epidural Patient position: sitting Prep: DuraPrep Patient monitoring: heart rate and blood pressure Approach: midline Location: L3-L4 Injection technique: LOR saline  Needle:  Needle type: Tuohy  Needle gauge: 17 G Needle insertion depth: 5 cm Catheter type: closed end Catheter size: 19 Gauge Catheter at skin depth: 10 cm Test dose: negative  Assessment Events: blood not aspirated, injection not painful, no injection resistance, negative IV test and no paresthesia  Additional Notes   Tolerated well.Reason for block:procedure for pain

## 2015-12-02 NOTE — Lactation Note (Signed)
This note was copied from a baby's chart. Lactation Consultation Note  Patient Name: Christine Alexander [id number] Date: 12/02/2015 Reason for consult: Initial assessment Baby at 4 hr of life and mom reports she is feeding well. She denies any breast or nipple pain. Per RN mom has short shaft nipples and brought in inverted nipple shells and a Harmony. LC reviewed shells and Harmony.Mom stated the RN showed her how to manually express, spoon in the room. Discussed baby behavior, feeding frequency, baby belly size, voids, wt loss, breast changes, and nipple care. Given lactation handouts. Aware of OP services and support group.    Maternal Data Has patient been taught Hand Expression?: Yes Does the patient have breastfeeding experience prior to this delivery?: No  Feeding Feeding Type: Breast Fed Length of feed: 30 min (interm)  LATCH Score/Interventions Latch: Too sleepy or reluctant, no latch achieved, no sucking elicited. Intervention(s): Skin to skin;Waking techniques;Teach feeding cues Intervention(s): Breast massage;Breast compression;Adjust position;Assist with latch  Audible Swallowing: None Intervention(s): Skin to skin  Type of Nipple: Everted at rest and after stimulation (small short nipple)  Comfort (Breast/Nipple): Soft / non-tender     Hold (Positioning): Full assist, staff holds infant at breast Intervention(s): Support Pillows  LATCH Score: 4  Lactation Tools Discussed/Used WIC Program: No Pump Review: Setup, frequency, and cleaning;Milk Storage Initiated by:: ES Date initiated:: 12/02/15   Consult Status Consult Status: Follow-up Date: 12/03/15 Follow-up type: In-patient    Rulon Eisenmenger 12/02/2015, 4:38 PM

## 2015-12-02 NOTE — Progress Notes (Signed)
Pt. Up to bathroom. FHR reactive category 1 before removal of EFM

## 2015-12-02 NOTE — Progress Notes (Signed)
Saline-locked IV in patient's right hand painful and occluded; removed at 2156. Patient understands that  rhogam shot will be IM. Theda Sers, RN [phone]

## 2015-12-02 NOTE — H&P (Signed)
20 y.o. [redacted]w[redacted]d  G1P0 comes in c/o ctx.  Otherwise has good fetal movement and no bleeding.  Past Medical History  Diagnosis Date  . Medical history non-contributory     Past Surgical History  Procedure Laterality Date  . Cholecystectomy  2015    OB History  Gravida Para Term Preterm AB SAB TAB Ectopic Multiple Living  1         0    # Outcome Date GA Lbr Len/2nd Weight Sex Delivery Anes PTL Lv  1 Current               Social History   Social History  . Marital Status: Married    Spouse Name: N/A  . Number of Children: N/A  . Years of Education: N/A   Occupational History  . Not on file.   Social History Main Topics  . Smoking status: Never Smoker   . Smokeless tobacco: Not on file  . Alcohol Use: No  . Drug Use: No  . Sexual Activity: Yes    Birth Control/ Protection: None   Other Topics Concern  . Not on file   Social History Narrative   Morphine and related    Prenatal Transfer Tool  Maternal Diabetes: No Genetic Screening: Normal Maternal Ultrasounds/Referrals: Normal Fetal Ultrasounds or other Referrals:  None Maternal Substance Abuse:  No Significant Maternal Medications:  None Significant Maternal Lab Results: Lab values include: Group B Strep negative  Other PNC: uncomplicated.    Filed Vitals:   12/02/15 0420 12/02/15 0600  BP: 132/80 139/69  Pulse: 96 93  Temp:  98.4 F (36.9 C)  Resp:  19     Lungs/Cor:  NAD Abdomen:  soft, gravid Ex:  no cords, erythema SVE:  Currently 7/90/-1 FHTs:  130, good STV, NST R Toco:  q2-3   A/P   Admit to L&D with labor  GBS Neg  Epidural if desired- pt was initially declining but later decided to proceed  AROM clear  Christine Alexander

## 2015-12-03 LAB — CBC
HCT: 30.6 % — ABNORMAL LOW (ref 36.0–46.0)
Hemoglobin: 10.4 g/dL — ABNORMAL LOW (ref 12.0–15.0)
MCH: 28.9 pg (ref 26.0–34.0)
MCHC: 34 g/dL (ref 30.0–36.0)
MCV: 85 fL (ref 78.0–100.0)
PLATELETS: 184 10*3/uL (ref 150–400)
RBC: 3.6 MIL/uL — AB (ref 3.87–5.11)
RDW: 14.3 % (ref 11.5–15.5)
WBC: 13 10*3/uL — ABNORMAL HIGH (ref 4.0–10.5)

## 2015-12-03 MED ORDER — RHO D IMMUNE GLOBULIN 1500 UNIT/2ML IJ SOSY
300.0000 ug | PREFILLED_SYRINGE | Freq: Once | INTRAMUSCULAR | Status: AC
  Administered 2015-12-03: 300 ug via INTRAMUSCULAR
  Filled 2015-12-03: qty 2

## 2015-12-03 NOTE — Anesthesia Postprocedure Evaluation (Signed)
Anesthesia Post Note  Patient: Christine Alexander  Procedure(s) Performed: * No procedures listed *  Patient location during evaluation: Mother Baby Anesthesia Type: Epidural Level of consciousness: awake Pain management: pain level controlled Vital Signs Assessment: post-procedure vital signs reviewed and stable Respiratory status: spontaneous breathing Cardiovascular status: stable Postop Assessment: no headache, no backache, epidural receding, patient able to bend at knees, no signs of nausea or vomiting and adequate PO intake Anesthetic complications: no    Last Vitals:  Filed Vitals:   12/02/15 1920 12/03/15 0535  BP: 121/78 111/65  Pulse: 98 74  Temp: 36.8 C 36.6 C  Resp: 18 18    Last Pain:  Filed Vitals:   12/03/15 0634  PainSc: 1                  Chevy Sweigert

## 2015-12-03 NOTE — Discharge Summary (Signed)
Obstetric Discharge Summary Reason for Admission: onset of labor Prenatal Procedures: none Intrapartum Procedures: spontaneous vaginal delivery Postpartum Procedures: Rho(D) Ig Complications-Operative and Postpartum: none HEMOGLOBIN  Date Value Ref Range Status  12/03/2015 10.4* 12.0 - 15.0 g/dL Final   HCT  Date Value Ref Range Status  12/03/2015 30.6* 36.0 - 46.0 % Final   Discharge Diagnoses: Term Pregnancy-delivered  Discharge Information: Date: 12/03/2015 Activity: pelvic rest Diet: routine Medications: Ibuprofen Condition: stable Instructions: refer to practice specific booklet Discharge to: home Follow-up Information    Follow up with CALLAHAN, SIDNEY, DO. Schedule an appointment as soon as possible for a visit in 4 weeks.   Specialty:  Obstetrics and Gynecology   Contact information:   [address] [place] [address] [phone]       Newborn Data: Live born female  Birth Weight: 8 lb 1.1 oz (3660 g) APGAR: 8, 9  Home with mother.  Christine Alexander A 12/03/2015, 10:58 AM

## 2015-12-03 NOTE — Lactation Note (Addendum)
This note was copied from a baby's chart. Lactation Consultation Note  Patient Name: Girl Caffie Sotto [id number]'[id number] Date: 12/03/2015 Reason for consult: Follow-up assessment  Baby 24 hours old. Mom states that she just finished nursing baby for 30 minutes and she feels baby is nursing much better now. Mom reports that she is nursing in football position, so demonstrated to mom how to position her pillows in order to facilitate her own comfort and maintain a deep latch while baby at breast. Mom reports that she thinks the problems she was having earlier were due to baby being spitty and gassy. Mom states that she is hearing the baby swallow at breast, and is seeing her own colostrum/transitional milk and it seems to be increasing. Enc mom to keep nursing often and hold baby upright on her chest after nursing.   Mom reports that she thinks the breast shells and pre-pumping with manual pump are helping the baby to latch on better. Mom aware of OP/BFSG and LC phone line assistance after D/C.  Maternal Data    Feeding Feeding Type: Breast Fed Length of feed: 30 min  LATCH Score/Interventions                      Lactation Tools Discussed/Used     Consult Status Consult Status: Follow-up Date: 12/04/15 Follow-up type: In-patient    Geralynn Ochs 12/03/2015, 1:03 PM

## 2015-12-03 NOTE — Progress Notes (Signed)
Patient is doing well.  She is ambulating, voiding, tolerating PO.  Pain control is good.  Lochia is appropriate  Filed Vitals:   12/02/15 1420 12/02/15 1520 12/02/15 1920 12/03/15 0535  BP: 114/58 108/63 121/78 111/65  Pulse: 87 98 98 74  Temp: 99.3 F (37.4 C) 98.6 F (37 C) 98.3 F (36.8 C) 97.9 F (36.6 C)  TempSrc: Oral Oral Oral   Resp: Height:      Weight:      SpO2:  99% 100%     NAD Fundus firm Ext: no edema  Lab Results  Component Value Date   WBC 13.0* 12/03/2015   HGB 10.4* 12/03/2015   HCT 30.6* 12/03/2015   MCV 85.0 12/03/2015   PLT 184 12/03/2015    --/--/O NEG, O NEG (02/19 0135)/RImmune  A/P 19 y.o. G1P1001 PPD#1 s/p TSVD. Routine care.   Rh neg--baby is Rh positive--rhogam studies pending Expect d/c tomorrow.    [facility] GEFFEL [employer]

## 2015-12-04 LAB — RH IG WORKUP (INCLUDES ABO/RH)
ABO/RH(D): O NEG
Fetal Screen: NEGATIVE
GESTATIONAL AGE(WKS): 40.1
Unit division: 0

## 2015-12-04 NOTE — Progress Notes (Signed)
Patient is eating, ambulating, voiding.  Pain control is good.  Filed Vitals:   12/02/15 1920 12/03/15 0535 12/03/15 1821 12/04/15 0723  BP: 121/78 111/65 129/76 127/72  Pulse: 98 74 72 70  Temp: 98.3 F (36.8 C) 97.9 F (36.6 C) 98.2 F (36.8 C) 98 F (36.7 C)  TempSrc: Oral  Oral Oral  Resp: Height:      Weight:      SpO2: 100%       Fundus firm Perineum without swelling.  Lab Results  Component Value Date   WBC 13.0* 12/03/2015   HGB 10.4* 12/03/2015   HCT 30.6* 12/03/2015   MCV 85.0 12/03/2015   PLT 184 12/03/2015    --/--/O NEG (02/20 0515)/RI  A/P Post partum day 2.  Baby RH pos- pt got Rhogam.  Routine care.  Expect d/c today.    Ernesto Zukowski A

## 2015-12-04 NOTE — Lactation Note (Signed)
This note was copied from a baby's chart. Lactation Consultation Note  Patient Name: Christine Alexander [id number] Date: 12/04/2015 Reason for consult: Follow-up assessment Baby 39 hours old. Mom states that the baby is nursing well at the breast, but that she has also been giving the baby formula as well as pumping and bottle-feeding EBM. Mom has about an ounce of EBM at bedside and baby is sleeping in the crib. Enc mom to put baby to breast first with each feeding, and then if the baby is not satisfied, mom can give EBM. Patient's nurse, Sharyn Lull, RN stated that she is hearing gulps while baby at breast. Enc mom to focus on BF baby. Discussed with mom that she can hand express or use hand pump for EBM. Also demonstrated how to use the piston in her pumping kit for manually pumping both breasts simultaneously.  Referred mom to Baby and Me booklet for number of diapers to expect by day of life and EBM storage guidelines. Mom aware of OP/BFSG and [place] phone line assistance after D/C.  Maternal Data    Feeding Feeding Type: Breast Fed Nipple Type: Slow - flow  LATCH Score/Interventions Latch: Grasps breast easily, tongue down, lips flanged, rhythmical sucking.  Audible Swallowing: Spontaneous and intermittent  Type of Nipple: Everted at rest and after stimulation  Comfort (Breast/Nipple): Soft / non-tender     Hold (Positioning): Assistance needed to correctly position infant at breast and maintain latch. Intervention(s): Breastfeeding basics reviewed;Support Pillows  LATCH Score: 9  Lactation Tools Discussed/Used     Consult Status Consult Status: PRN    Inocente Salles 12/04/2015, 11:12 AM

## 2015-12-15 ENCOUNTER — Telehealth: Payer: Self-pay | Admitting: Pediatrics

## 2015-12-15 DIAGNOSIS — N61 Mastitis without abscess: Secondary | ICD-10-CM

## 2015-12-15 MED ORDER — CEPHALEXIN 500 MG PO CAPS: 500.0000 mg | ORAL_CAPSULE | Freq: Four times a day (QID) | ORAL | Status: DC

## 2015-12-15 NOTE — Telephone Encounter (Signed)
L sided mastitis, red erythematous top portion of L breast, pt seen today with infant daughter. Discussed return precautions.

## 2015-12-20 ENCOUNTER — Telehealth (HOSPITAL_COMMUNITY): Payer: Self-pay | Admitting: Lactation Services

## 2015-12-20 NOTE — Telephone Encounter (Signed)
Mom reports that she had a good supply initially, then developed mastitis. Also, mom has had a cold and was on antibiotics, but stopped taking Cephalexin yesterday because she thought it was causing a decreased supply of milk and not good for breastfeeding baby. Mom states that she was using OTC medications for symptoms instead. Discussed with mom that mastitis and not feeling well may be lowering her supply, and that Cephalexin is compatible with breastfeeding. Enc mom to start taking her antibiotics again now as instructed by HCP and call her HCP tomorrow to see how to address the lapse in taking the medication as prescribed. Enc mom to get plenty of rest, eat well, drink water for thirst, and pump 4-6 times after having baby at breast. Enc mom to use OTC medications carefully, only using the plain version of medication and antihistamines can reduce milk supply just as they are designed to dry up congestion. Enc using saline or neti pot--non-medication interventions for symptoms--in addition to antibiotic as prescribed. Enc mom to call back for assistance as needed.

## 2016-01-10 ENCOUNTER — Ambulatory Visit (INDEPENDENT_AMBULATORY_CARE_PROVIDER_SITE_OTHER): Payer: Medicaid Other | Admitting: Nurse Practitioner

## 2016-01-10 ENCOUNTER — Encounter: Payer: Self-pay | Admitting: Nurse Practitioner

## 2016-01-10 VITALS — BP 105/69 | HR 89 | Temp 98.0°F | Ht 63.0 in | Wt 199.0 lb

## 2016-01-10 DIAGNOSIS — R062 Wheezing: Secondary | ICD-10-CM

## 2016-01-10 MED ORDER — PREDNISONE 20 MG PO TABS: ORAL_TABLET | ORAL | Status: DC

## 2016-01-10 MED ORDER — METHYLPREDNISOLONE ACETATE 80 MG/ML IJ SUSP
80.0000 mg | Freq: Once | INTRAMUSCULAR | Status: AC
  Administered 2016-01-10: 80 mg via INTRAMUSCULAR

## 2016-01-10 MED ORDER — LEVALBUTEROL HCL 0.63 MG/3ML IN NEBU
0.6300 mg | INHALATION_SOLUTION | Freq: Once | RESPIRATORY_TRACT | Status: AC
  Administered 2016-01-10: 0.63 mg via RESPIRATORY_TRACT

## 2016-01-10 MED ORDER — ALBUTEROL SULFATE HFA 108 (90 BASE) MCG/ACT IN AERS
2.0000 | INHALATION_SPRAY | Freq: Once | RESPIRATORY_TRACT | Status: DC
Start: 2016-01-10 — End: 2016-01-10

## 2016-01-10 MED ORDER — ALBUTEROL SULFATE HFA 108 (90 BASE) MCG/ACT IN AERS: 2.0000 | INHALATION_SPRAY | Freq: Four times a day (QID) | RESPIRATORY_TRACT | Status: DC | PRN

## 2016-01-10 NOTE — Progress Notes (Addendum)
   Subjective:    Patient ID: Christine Alexander, female    DOB: 02-20-1996, 20 y.o.   MRN: [id number]  HPI Patient in c/o SOB  And wheezing- started last night. Denies cough and congestion.    Review of Systems  Constitutional: Negative.   HENT: Negative.   Respiratory: Negative.   Cardiovascular: Negative.   Genitourinary: Negative.   Neurological: Negative.   Psychiatric/Behavioral: Negative.   All other systems reviewed and are negative.      Objective:   Physical Exam  Constitutional: She appears well-developed and well-nourished.  Cardiovascular: Normal rate, regular rhythm and normal heart sounds.   Pulmonary/Chest: Effort normal. She has wheezes (tight expiratory wheezes).  SAO2 97% room air SAO2  93% with exertion   S/p xopenex nebulizer- lower breath sounds with very faint exp wheezes       Assessment & Plan:   1. Wheezing    Meds ordered this encounter  Medications  . levalbuterol (XOPENEX) nebulizer solution 0.63 mg    Sig:   . methylPREDNISolone acetate (DEPO-MEDROL) injection 80 mg    Sig:   . predniSONE (DELTASONE) 20 MG tablet    Sig: 2 po at sametime daily for 5 days- start tomorrow    Dispense:  10 tablet    Refill:  0    Order Specific Question:  Supervising Provider    Answer:  Ernestina PennaMOORE, DONALD W [[id number]]         . albuterol (PROVENTIL HFA;VENTOLIN HFA) 108 (90 Base) MCG/ACT inhaler    Sig: Inhale 2 puffs into the lungs every 6 (six) hours as needed for wheezing or shortness of breath.    Dispense:  1 Inhaler    Refill:  0    Order Specific Question:  Supervising Provider    Answer:  Ernestina PennaMOORE, DONALD W [[id number]   Rest If SOB increases go to ER  Just prior to giving depo shot patient said just had baby- suggested going to ER to make sure does  Not havea PE before getting any med- patient understands - depo shot not given  Christine Daphine DeutscherMartin, FNP

## 2016-01-23 ENCOUNTER — Ambulatory Visit (INDEPENDENT_AMBULATORY_CARE_PROVIDER_SITE_OTHER): Payer: Medicaid Other | Admitting: Nurse Practitioner

## 2016-01-23 ENCOUNTER — Encounter: Payer: Self-pay | Admitting: Nurse Practitioner

## 2016-01-23 VITALS — BP 97/65 | HR 69 | Temp 97.6°F | Ht 63.0 in | Wt 197.6 lb

## 2016-01-23 DIAGNOSIS — J029 Acute pharyngitis, unspecified: Secondary | ICD-10-CM | POA: Diagnosis not present

## 2016-01-23 LAB — VERITOR FLU A/B WAIVED
INFLUENZA A: NEGATIVE
INFLUENZA B: NEGATIVE

## 2016-01-23 LAB — CULTURE, GROUP A STREP

## 2016-01-23 LAB — RAPID STREP SCREEN (MED CTR MEBANE ONLY): Strep Gp A Ag, IA W/Reflex: NEGATIVE

## 2016-01-23 MED ORDER — AMOXICILLIN 875 MG PO TABS: 875.0000 mg | ORAL_TABLET | Freq: Two times a day (BID) | ORAL | Status: DC

## 2016-01-23 NOTE — Patient Instructions (Signed)

## 2016-01-23 NOTE — Progress Notes (Signed)
  Subjective:     Zachery Conchesha Zeiss is a 20 y.o. female who presents for evaluation of sore throat. Associated symptoms include fevers up to 102 2 days ago degrees, chest congestion, nasal blockage, post nasal drip, sinus and nasal congestion and sore throat. Onset of symptoms was 3 days ago, and have been gradually improving since that time. She is drinking plenty of fluids. She has not had a recent close exposure to someone with proven streptococcal pharyngitis.  The following portions of the patient's history were reviewed and updated as appropriate: allergies, current medications, past family history, past medical history, past social history, past surgical history and problem list.  Review of Systems Pertinent items are noted in HPI.    Objective:    BP 97/65 mmHg  Pulse 69  Temp(Src) 97.6 F (36.4 C) (Oral)  Ht 5\' 3"  (1.6 m)  Wt 197 lb 9.6 oz (89.631 kg)  BMI 35.01 kg/m2  Breastfeeding? Yes General appearance: alert and cooperative Eyes: conjunctivae/corneas clear. PERRL, EOM's intact. Fundi benign. Ears: normal TM and external ear canal right ear and abnormal TM left ear - erythematous and bulging Nose: purulent discharge, moderate congestion, turbinates normal, no sinus tenderness Throat: lips, mucosa, and tongue normal; teeth and gums normal Lungs: clear to auscultation bilaterally Heart: regular rate and rhythm, S1, S2 normal, no murmur, click, rub or gallop  Laboratory Strep test done. Results:negative.   FLu A/B- neg/neg Assessment:    Acute pharyngitis, likely  upper resp infection with cough and left otitis media.    Plan:  1. Take meds as prescribed 2. Use a cool mist humidifier especially during the winter months and when heat has been humid. 3. Use saline nose sprays frequently 4. Saline irrigations of the nose can be very helpful if done frequently.  * 4X daily for 1 week*  * Use of a nettie pot can be helpful with this. Follow directions with this* 5. Drink  plenty of fluids 6. Keep thermostat turn down low 7.For any cough or congestion  Use plain Mucinex- regular strength or max strength is fine   * Children- consult with Pharmacist for dosing 8. For fever or aces or pains- take tylenol or ibuprofen appropriate for age and weight.  * for fevers greater than 101 orally you may alternate ibuprofen and tylenol every  3 hours.     Current Outpatient Prescriptions on File Prior to Visit  Medication Sig Dispense Refill  . albuterol (PROVENTIL HFA;VENTOLIN HFA) 108 (90 Base) MCG/ACT inhaler Inhale 2 puffs into the lungs every 6 (six) hours as needed for wheezing or shortness of breath. (Patient not taking: Reported on 01/23/2016) 1 Inhaler 0   No current facility-administered medications on file prior to visit.   Mary-Margaret Daphine DeutscherMartin, FNP

## 2016-01-31 ENCOUNTER — Encounter (INDEPENDENT_AMBULATORY_CARE_PROVIDER_SITE_OTHER): Payer: Self-pay

## 2016-02-13 ENCOUNTER — Telehealth: Payer: Self-pay | Admitting: Family Medicine

## 2016-02-13 NOTE — Telephone Encounter (Signed)
Patient called stating that she is still having episodes of SOB like she was having at her visit on 3-31. You had advised her to go to ER that night to check for a PE and patient was unable to go. Please advise patient as to what she should do. Do we need to set her up a referral to have this testing done or should she go ahead to ER.

## 2016-02-14 NOTE — Telephone Encounter (Signed)
Left message, per provider, go to emergency department if you experience another episode of shortness of breath.

## 2016-02-14 NOTE — Telephone Encounter (Signed)
Needs to go to ER

## 2016-10-13 NOTE — L&D Delivery Note (Signed)
Patient was C/C/+3 and pushed for 6 minutes with epidural.   NSVD  female infant, Apgars 9,9, weight P.   The patient had no lacerations. Fundus was firm. EBL was expected amount. Placenta was delivered intact. Vagina was clear.  Baby was vigorous and doing skin to skin with mother.  Travin Marik A

## 2016-11-10 ENCOUNTER — Telehealth: Payer: Self-pay | Admitting: Pediatrics

## 2016-11-10 DIAGNOSIS — F419 Anxiety disorder, unspecified: Secondary | ICD-10-CM

## 2016-11-10 MED ORDER — FLUOXETINE HCL 40 MG PO CAPS: 40.0000 mg | ORAL_CAPSULE | Freq: Every day | ORAL | 4 refills | Status: DC

## 2016-11-10 NOTE — Telephone Encounter (Signed)
Started on fluoxetine 3-6 months ago in [place] for [place] Has been working well, thinks it could do better Will increase to 40mg , f/u in 2-4 weeks Here with daughter for sick visit

## 2016-11-17 ENCOUNTER — Telehealth: Payer: Self-pay | Admitting: Pediatrics

## 2016-11-17 DIAGNOSIS — F419 Anxiety disorder, unspecified: Secondary | ICD-10-CM

## 2016-11-17 MED ORDER — FLUOXETINE HCL 40 MG PO CAPS: 40.0000 mg | ORAL_CAPSULE | Freq: Every day | ORAL | 4 refills | Status: DC

## 2016-11-20 ENCOUNTER — Other Ambulatory Visit: Payer: Self-pay | Admitting: *Deleted

## 2016-11-20 MED ORDER — FLUOXETINE HCL 20 MG PO CAPS: 20.0000 mg | ORAL_CAPSULE | Freq: Every day | ORAL | 3 refills | Status: DC

## 2016-11-26 DIAGNOSIS — Z6834 Body mass index (BMI) 34.0-34.9, adult: Secondary | ICD-10-CM | POA: Diagnosis not present

## 2016-11-26 DIAGNOSIS — Z4589 Encounter for adjustment and management of other implanted devices: Secondary | ICD-10-CM | POA: Diagnosis not present

## 2017-01-07 ENCOUNTER — Encounter: Payer: Self-pay | Admitting: Pediatrics

## 2017-01-07 ENCOUNTER — Ambulatory Visit (INDEPENDENT_AMBULATORY_CARE_PROVIDER_SITE_OTHER): Payer: Medicaid Other | Admitting: Pediatrics

## 2017-01-07 VITALS — BP 107/74 | HR 74 | Temp 98.4°F | Ht 63.0 in | Wt 192.8 lb

## 2017-01-07 DIAGNOSIS — M6740 Ganglion, unspecified site: Secondary | ICD-10-CM

## 2017-01-07 DIAGNOSIS — N926 Irregular menstruation, unspecified: Secondary | ICD-10-CM | POA: Diagnosis not present

## 2017-01-07 DIAGNOSIS — R635 Abnormal weight gain: Secondary | ICD-10-CM

## 2017-01-07 NOTE — Progress Notes (Signed)
  Subjective:   Patient ID: Christine Alexander, female    DOB: 03/10/1996, 21 y.o.   MRN: [id number] CC: lump on right hand and weight gain HPI: Christine Alexander is a 21 y.o. female presenting for lump on right hand  Was 150 lb before pregnancy Going to the gym regularly recently Stopped for a week and gained 10 lbs Cooking meals at home Snacking rarely Drinking mostly water Rarely eating sugar  Had nexplanon, now on different BC plan (OCP) though has recently stopped, trying to get pregnant LMP 6 weeks ago, stopped BC apprx 3 weeks ago  Has bump on R R wrist that has been there for a few days Hurts when she moves her wrist No injury to wrist, no redness, swelling Has never had swelling before  Relevant past medical, surgical, family and social history reviewed. Allergies and medications reviewed and updated. History  Smoking Status  . Never Smoker  Smokeless Tobacco  . Never Used   ROS: Per HPI   Objective:    BP 107/74   Pulse 74   Temp 98.4 F (36.9 C) (Oral)   Ht '5\' 3"'$  (1.6 m)   Wt 192 lb 12.8 oz (87.5 kg)   BMI 34.15 kg/m   Wt Readings from Last 3 Encounters:  01/07/17 192 lb 12.8 oz (87.5 kg)  01/23/16 197 lb 9.6 oz (89.6 kg)  01/10/16 199 lb (90.3 kg)    Gen: NAD, alert, cooperative with exam, NCAT EYES: EOMI, no conjunctival injection, or no icterus ENT:  TMs pearly gray b/l, OP without erythema LYMPH: no cervical LAD CV: NRRR, normal S1/S2, no murmur, distal pulses 2+ b/l Resp: CTABL, no wheezes, normal WOB Ext: No edema, warm Neuro: Alert and oriented, strength equal b/l UE and LE, coordination grossly normal MSK: R ext surface wrist with apprx 1 cm well circumscribed nodule with fluctuance, no redness, no discharge. Mildly tender with wrist ROM, minimally tender with palpation.  Assessment & Plan:  Christine Alexander was seen today for lump on right hand.  Diagnoses and all orders for this visit:  Weight gain Discussed lifestyle changes, increased activity, decreased  sugar intake -     CBC with Differential/Platelet -     BMP8+EGFR -     TSH  Ganglion cyst Bothering pt, on dominant hand -     Ambulatory referral to Orthopedic Surgery  Missed period Recently stopped OCP -     Beta HCG, Quant   Follow up plan: Return in about 3 months (around 04/09/2017). Assunta Found, MD [place]

## 2017-01-09 LAB — BMP8+EGFR
BUN / CREAT RATIO: 15 (ref 9–23)
BUN: 9 mg/dL (ref 6–20)
CALCIUM: 9.5 mg/dL (ref 8.7–10.2)
CHLORIDE: 102 mmol/L (ref 96–106)
CO2: 23 mmol/L (ref 18–29)
Creatinine, Ser: 0.6 mg/dL (ref 0.57–1.00)
GFR calc Af Amer: 150 mL/min/{1.73_m2} (ref >59)
GFR calc non Af Amer: 130 mL/min/{1.73_m2} (ref >59)
GLUCOSE: 91 mg/dL (ref 65–99)
Potassium: 4.5 mmol/L (ref 3.5–5.2)
Sodium: 141 mmol/L (ref 134–144)

## 2017-01-09 LAB — CBC WITH DIFFERENTIAL/PLATELET
Basophils Absolute: 0.1 10*3/uL (ref 0.0–0.2)
Basos: 1 %
EOS (ABSOLUTE): 0.6 10*3/uL — ABNORMAL HIGH (ref 0.0–0.4)
EOS: 8 %
HEMOGLOBIN: 13 g/dL (ref 11.1–15.9)
Hematocrit: 39.1 % (ref 34.0–46.6)
IMMATURE GRANS (ABS): 0.1 10*3/uL (ref 0.0–0.1)
Immature Granulocytes: 1 %
LYMPHS ABS: 3 10*3/uL (ref 0.7–3.1)
Lymphs: 40 %
MCH: 28.2 pg (ref 26.6–33.0)
MCHC: 33.2 g/dL (ref 31.5–35.7)
MCV: 85 fL (ref 79–97)
MONOS ABS: 0.6 10*3/uL (ref 0.1–0.9)
Monocytes: 8 %
Neutrophils Absolute: 3.2 10*3/uL (ref 1.4–7.0)
Neutrophils: 42 %
PLATELETS: 258 10*3/uL (ref 150–379)
RBC: 4.61 x10E6/uL (ref 3.77–5.28)
RDW: 13.9 % (ref 12.3–15.4)
WBC: 7.7 10*3/uL (ref 3.4–10.8)

## 2017-01-09 LAB — BETA HCG QUANT (REF LAB): hCG Quant: <1 m[IU]/mL

## 2017-01-09 LAB — TSH: TSH: 2.64 u[IU]/mL (ref 0.450–4.500)

## 2017-01-12 DIAGNOSIS — Z6834 Body mass index (BMI) 34.0-34.9, adult: Secondary | ICD-10-CM | POA: Diagnosis not present

## 2017-01-12 DIAGNOSIS — Z3202 Encounter for pregnancy test, result negative: Secondary | ICD-10-CM | POA: Diagnosis not present

## 2017-01-12 DIAGNOSIS — Z01419 Encounter for gynecological examination (general) (routine) without abnormal findings: Secondary | ICD-10-CM | POA: Diagnosis not present

## 2017-01-12 DIAGNOSIS — Z124 Encounter for screening for malignant neoplasm of cervix: Secondary | ICD-10-CM | POA: Diagnosis not present

## 2017-01-12 LAB — OB RESULTS CONSOLE GC/CHLAMYDIA
Chlamydia: NEGATIVE
GC PROBE AMP, GENITAL: NEGATIVE

## 2017-01-21 ENCOUNTER — Telehealth: Payer: Self-pay | Admitting: Family Medicine

## 2017-01-21 NOTE — Telephone Encounter (Signed)
Patient states that her caseworker said as long as she changed the name on the card she could get the referral for [facility] for her ganglion cyst. Patient states now her insure has [place] as the PCP so is asking if we can now put in another referral?

## 2017-01-21 NOTE — Telephone Encounter (Signed)
lmtcb

## 2017-01-21 NOTE — Telephone Encounter (Signed)
Referral activated

## 2017-01-30 ENCOUNTER — Encounter: Payer: Self-pay | Admitting: Family Medicine

## 2017-01-30 ENCOUNTER — Ambulatory Visit (INDEPENDENT_AMBULATORY_CARE_PROVIDER_SITE_OTHER): Payer: Medicaid Other | Admitting: Family Medicine

## 2017-01-30 VITALS — BP 104/73 | HR 76 | Temp 98.6°F | Ht 63.0 in | Wt 195.4 lb

## 2017-01-30 DIAGNOSIS — N912 Amenorrhea, unspecified: Secondary | ICD-10-CM | POA: Diagnosis not present

## 2017-01-30 LAB — PREGNANCY, URINE: Preg Test, Ur: NEGATIVE

## 2017-01-30 NOTE — Progress Notes (Signed)
   HPI  Patient presents today here with concern for pregnancy with missing..  Patient's last period was March 16.  Patient explains that she missed her period 5 days ago, she had one home pregnancy test that was positive, then she had a negative one on the same day.  She has some bloating and breast tenderness consistent with previous pregnancy.  This is a happy time and a good news for her if she is positive.   PMH: Smoking status noted ROS: Per HPI  Objective: BP 104/73   Pulse 76   Temp 98.6 F (37 C) (Oral)   Ht  (1.6 m)   Wt 195 lb 6.4 oz (88.6 kg)   BMI 34.61 kg/m  Gen: NAD, alert, cooperative with exam HEENT: NCAT CV: RRR, good S1/S2, no murmur Resp: CTABL, no wheezes, non-labored Ext: No edema, warm Neuro: Alert and oriented, No gross deficits  Assessment and plan:  # Amenorrhea Pregnancy test negative in the office, with symptoms of breast tenderness and amenorrhea I think it's very reasonable to proceed with hCG quantitative test. Discussed reasons to return her reasons to be concerned.      Orders Placed This Encounter  Procedures  . Pregnancy, urine  . hCG, quantitative, pregnancy    Meds ordered this encounter  Medications  . Prenatal Vit-Fe Fumarate-FA (PRENATAL 19 PO)    Sig: Take by mouth.    Murtis Sink, MD [facility] 01/30/2017, 9:10 AM

## 2017-01-30 NOTE — Addendum Note (Signed)
Addended by: Caryl Bis on: 01/30/2017 09:27 AM   Modules accepted: Orders

## 2017-01-31 LAB — BETA HCG QUANT (REF LAB): hCG Quant: 97 m[IU]/mL

## 2017-02-02 ENCOUNTER — Other Ambulatory Visit: Payer: Medicaid Other

## 2017-02-02 DIAGNOSIS — Z3490 Encounter for supervision of normal pregnancy, unspecified, unspecified trimester: Secondary | ICD-10-CM

## 2017-02-03 LAB — BETA HCG QUANT (REF LAB): HCG QUANT: 454 m[IU]/mL

## 2017-03-16 DIAGNOSIS — Z6834 Body mass index (BMI) 34.0-34.9, adult: Secondary | ICD-10-CM | POA: Diagnosis not present

## 2017-03-16 DIAGNOSIS — N925 Other specified irregular menstruation: Secondary | ICD-10-CM | POA: Diagnosis not present

## 2017-03-16 DIAGNOSIS — Z3480 Encounter for supervision of other normal pregnancy, unspecified trimester: Secondary | ICD-10-CM | POA: Diagnosis not present

## 2017-03-16 DIAGNOSIS — Z1371 Encounter for nonprocreative screening for genetic disease carrier status: Secondary | ICD-10-CM | POA: Diagnosis not present

## 2017-03-16 LAB — OB RESULTS CONSOLE HEPATITIS B SURFACE ANTIGEN: Hepatitis B Surface Ag: NEGATIVE

## 2017-03-16 LAB — OB RESULTS CONSOLE RPR: RPR: NONREACTIVE

## 2017-03-16 LAB — OB RESULTS CONSOLE HIV ANTIBODY (ROUTINE TESTING): HIV: NONREACTIVE

## 2017-03-16 LAB — OB RESULTS CONSOLE RUBELLA ANTIBODY, IGM: Rubella: IMMUNE

## 2017-04-14 DIAGNOSIS — Z3482 Encounter for supervision of other normal pregnancy, second trimester: Secondary | ICD-10-CM | POA: Diagnosis not present

## 2017-05-13 DIAGNOSIS — Z363 Encounter for antenatal screening for malformations: Secondary | ICD-10-CM | POA: Diagnosis not present

## 2017-05-18 DIAGNOSIS — R300 Dysuria: Secondary | ICD-10-CM | POA: Diagnosis not present

## 2017-06-11 DIAGNOSIS — O359XX1 Maternal care for (suspected) fetal abnormality and damage, unspecified, fetus 1: Secondary | ICD-10-CM | POA: Diagnosis not present

## 2017-07-10 DIAGNOSIS — Z23 Encounter for immunization: Secondary | ICD-10-CM | POA: Diagnosis not present

## 2017-07-10 DIAGNOSIS — R5383 Other fatigue: Secondary | ICD-10-CM | POA: Diagnosis not present

## 2017-07-10 DIAGNOSIS — O360990 Maternal care for other rhesus isoimmunization, unspecified trimester, not applicable or unspecified: Secondary | ICD-10-CM | POA: Diagnosis not present

## 2017-07-10 DIAGNOSIS — Z3480 Encounter for supervision of other normal pregnancy, unspecified trimester: Secondary | ICD-10-CM | POA: Diagnosis not present

## 2017-07-10 DIAGNOSIS — Z6837 Body mass index (BMI) 37.0-37.9, adult: Secondary | ICD-10-CM | POA: Diagnosis not present

## 2017-07-10 LAB — OB RESULTS CONSOLE RPR: RPR: NONREACTIVE

## 2017-07-23 DIAGNOSIS — Z23 Encounter for immunization: Secondary | ICD-10-CM | POA: Diagnosis not present

## 2017-09-02 DIAGNOSIS — Z3480 Encounter for supervision of other normal pregnancy, unspecified trimester: Secondary | ICD-10-CM | POA: Diagnosis not present

## 2017-09-02 DIAGNOSIS — O26849 Uterine size-date discrepancy, unspecified trimester: Secondary | ICD-10-CM | POA: Diagnosis not present

## 2017-10-02 DIAGNOSIS — O480 Post-term pregnancy: Secondary | ICD-10-CM | POA: Diagnosis not present

## 2017-10-03 ENCOUNTER — Inpatient Hospital Stay (HOSPITAL_COMMUNITY): Payer: BLUE CROSS/BLUE SHIELD | Admitting: Anesthesiology

## 2017-10-03 ENCOUNTER — Inpatient Hospital Stay (HOSPITAL_COMMUNITY)
Admission: AD | Admit: 2017-10-03 | Discharge: 2017-10-04 | DRG: 807 | Disposition: A | Discharge status: Home / Self Care | Payer: BLUE CROSS/BLUE SHIELD | Source: Ambulatory Visit | Attending: Obstetrics and Gynecology | Admitting: Obstetrics and Gynecology

## 2017-10-03 ENCOUNTER — Encounter (HOSPITAL_COMMUNITY): Payer: Self-pay | Admitting: *Deleted

## 2017-10-03 ENCOUNTER — Other Ambulatory Visit: Payer: Self-pay

## 2017-10-03 DIAGNOSIS — Z3483 Encounter for supervision of other normal pregnancy, third trimester: Secondary | ICD-10-CM | POA: Diagnosis not present

## 2017-10-03 DIAGNOSIS — Z3A40 40 weeks gestation of pregnancy: Secondary | ICD-10-CM

## 2017-10-03 LAB — CBC
HEMATOCRIT: 40.6 % (ref 36.0–46.0)
HEMOGLOBIN: 14.2 g/dL (ref 12.0–15.0)
MCH: 29.6 pg (ref 26.0–34.0)
MCHC: 35 g/dL (ref 30.0–36.0)
MCV: 84.8 fL (ref 78.0–100.0)
Platelets: 199 10*3/uL (ref 150–400)
RBC: 4.79 MIL/uL (ref 3.87–5.11)
RDW: 14 % (ref 11.5–15.5)
WBC: 12.3 10*3/uL — ABNORMAL HIGH (ref 4.0–10.5)

## 2017-10-03 LAB — URINALYSIS, ROUTINE W REFLEX MICROSCOPIC
Bilirubin Urine: NEGATIVE
Glucose, UA: NEGATIVE mg/dL
Ketones, ur: NEGATIVE mg/dL
NITRITE: NEGATIVE
PROTEIN: NEGATIVE mg/dL
Specific Gravity, Urine: 1.017 (ref 1.005–1.030)
pH: 6 (ref 5.0–8.0)

## 2017-10-03 LAB — TYPE AND SCREEN
ABO/RH(D): O NEG
ANTIBODY SCREEN: NEGATIVE

## 2017-10-03 MED ORDER — FERROUS SULFATE 325 (65 FE) MG PO TABS
325.0000 mg | ORAL_TABLET | Freq: Two times a day (BID) | ORAL | Status: DC
  Administered 2017-10-03 – 2017-10-04 (×2): 325 mg via ORAL
  Filled 2017-10-03 (×2): qty 1

## 2017-10-03 MED ORDER — ONDANSETRON HCL 4 MG PO TABS: 4.0000 mg | ORAL_TABLET | ORAL | Status: DC | PRN

## 2017-10-03 MED ORDER — OXYTOCIN 40 UNITS IN LACTATED RINGERS INFUSION - SIMPLE MED
2.5000 [IU]/h | INTRAVENOUS | Status: DC
  Administered 2017-10-03: 2.5 [IU]/h via INTRAVENOUS
  Filled 2017-10-03: qty 1000

## 2017-10-03 MED ORDER — EPHEDRINE 5 MG/ML INJ
10.0000 mg | INTRAVENOUS | Status: DC | PRN
  Filled 2017-10-03: qty 2

## 2017-10-03 MED ORDER — SENNOSIDES-DOCUSATE SODIUM 8.6-50 MG PO TABS
2.0000 | ORAL_TABLET | ORAL | Status: DC
  Administered 2017-10-03: 2 via ORAL
  Filled 2017-10-03: qty 2

## 2017-10-03 MED ORDER — METHYLERGONOVINE MALEATE 0.2 MG PO TABS: 0.2000 mg | ORAL_TABLET | ORAL | Status: DC | PRN

## 2017-10-03 MED ORDER — VITAMIN K1 1 MG/0.5ML IJ SOLN
INTRAMUSCULAR | Status: AC
  Filled 2017-10-03: qty 0.5

## 2017-10-03 MED ORDER — DIPHENHYDRAMINE HCL 25 MG PO CAPS: 25.0000 mg | ORAL_CAPSULE | Freq: Four times a day (QID) | ORAL | Status: DC | PRN

## 2017-10-03 MED ORDER — IBUPROFEN 800 MG PO TABS
800.0000 mg | ORAL_TABLET | Freq: Three times a day (TID) | ORAL | Status: DC
  Administered 2017-10-03 – 2017-10-04 (×2): 800 mg via ORAL
  Filled 2017-10-03 (×2): qty 1

## 2017-10-03 MED ORDER — SODIUM CHLORIDE 0.9% FLUSH: 3.0000 mL | INTRAVENOUS | Status: DC | PRN

## 2017-10-03 MED ORDER — DIBUCAINE 1 % RE OINT: 1.0000 "application " | TOPICAL_OINTMENT | RECTAL | Status: DC | PRN

## 2017-10-03 MED ORDER — DIPHENHYDRAMINE HCL 50 MG/ML IJ SOLN: 12.5000 mg | INTRAMUSCULAR | Status: DC | PRN

## 2017-10-03 MED ORDER — MEASLES, MUMPS & RUBELLA VAC ~~LOC~~ INJ: 0.5000 mL | INJECTION | Freq: Once | SUBCUTANEOUS | Status: DC

## 2017-10-03 MED ORDER — TETANUS-DIPHTH-ACELL PERTUSSIS 5-2.5-18.5 LF-MCG/0.5 IM SUSP: 0.5000 mL | Freq: Once | INTRAMUSCULAR | Status: DC

## 2017-10-03 MED ORDER — LACTATED RINGERS IV SOLN
500.0000 mL | Freq: Once | INTRAVENOUS | Status: AC
  Administered 2017-10-03: 500 mL via INTRAVENOUS

## 2017-10-03 MED ORDER — LIDOCAINE HCL (PF) 1 % IJ SOLN
30.0000 mL | INTRAMUSCULAR | Status: DC | PRN
  Filled 2017-10-03: qty 30

## 2017-10-03 MED ORDER — LACTATED RINGERS IV SOLN: 500.0000 mL | INTRAVENOUS | Status: DC | PRN

## 2017-10-03 MED ORDER — PHENYLEPHRINE 40 MCG/ML (10ML) SYRINGE FOR IV PUSH (FOR BLOOD PRESSURE SUPPORT)
80.0000 ug | PREFILLED_SYRINGE | INTRAVENOUS | Status: DC | PRN
Start: 2017-10-03 — End: 2017-10-03
  Filled 2017-10-03: qty 5

## 2017-10-03 MED ORDER — ACETAMINOPHEN 325 MG PO TABS: 650.0000 mg | ORAL_TABLET | ORAL | Status: DC | PRN

## 2017-10-03 MED ORDER — METHYLERGONOVINE MALEATE 0.2 MG/ML IJ SOLN: 0.2000 mg | INTRAMUSCULAR | Status: DC | PRN

## 2017-10-03 MED ORDER — SIMETHICONE 80 MG PO CHEW: 80.0000 mg | CHEWABLE_TABLET | ORAL | Status: DC | PRN

## 2017-10-03 MED ORDER — MAGNESIUM HYDROXIDE 400 MG/5ML PO SUSP: 30.0000 mL | ORAL | Status: DC | PRN

## 2017-10-03 MED ORDER — LACTATED RINGERS IV SOLN
INTRAVENOUS | Status: DC
  Administered 2017-10-03: 11:00:00 via INTRAVENOUS

## 2017-10-03 MED ORDER — ONDANSETRON HCL 4 MG/2ML IJ SOLN: 4.0000 mg | Freq: Four times a day (QID) | INTRAMUSCULAR | Status: DC | PRN

## 2017-10-03 MED ORDER — ONDANSETRON HCL 4 MG/2ML IJ SOLN: 4.0000 mg | INTRAMUSCULAR | Status: DC | PRN

## 2017-10-03 MED ORDER — FLEET ENEMA 7-19 GM/118ML RE ENEM: 1.0000 | ENEMA | RECTAL | Status: DC | PRN

## 2017-10-03 MED ORDER — OXYTOCIN BOLUS FROM INFUSION
500.0000 mL | Freq: Once | INTRAVENOUS | Status: AC
  Administered 2017-10-03: 500 mL via INTRAVENOUS

## 2017-10-03 MED ORDER — SODIUM CHLORIDE 0.9% FLUSH: 3.0000 mL | Freq: Two times a day (BID) | INTRAVENOUS | Status: DC

## 2017-10-03 MED ORDER — ZOLPIDEM TARTRATE 5 MG PO TABS: 5.0000 mg | ORAL_TABLET | Freq: Every evening | ORAL | Status: DC | PRN

## 2017-10-03 MED ORDER — SODIUM CHLORIDE 0.9 % IV SOLN: 250.0000 mL | INTRAVENOUS | Status: DC | PRN

## 2017-10-03 MED ORDER — BENZOCAINE-MENTHOL 20-0.5 % EX AERO: 1.0000 "application " | INHALATION_SPRAY | CUTANEOUS | Status: DC | PRN

## 2017-10-03 MED ORDER — COCONUT OIL OIL: 1.0000 "application " | TOPICAL_OIL | Status: DC | PRN

## 2017-10-03 MED ORDER — LIDOCAINE HCL (PF) 1 % IJ SOLN
INTRAMUSCULAR | Status: DC | PRN
  Administered 2017-10-03 (×2): 5 mL

## 2017-10-03 MED ORDER — SOD CITRATE-CITRIC ACID 500-334 MG/5ML PO SOLN: 30.0000 mL | ORAL | Status: DC | PRN

## 2017-10-03 MED ORDER — WITCH HAZEL-GLYCERIN EX PADS: 1.0000 "application " | MEDICATED_PAD | CUTANEOUS | Status: DC | PRN

## 2017-10-03 MED ORDER — PRENATAL MULTIVITAMIN CH: 1.0000 | ORAL_TABLET | Freq: Every day | ORAL | Status: DC

## 2017-10-03 MED ORDER — PHENYLEPHRINE 40 MCG/ML (10ML) SYRINGE FOR IV PUSH (FOR BLOOD PRESSURE SUPPORT)
80.0000 ug | PREFILLED_SYRINGE | INTRAVENOUS | Status: DC | PRN
  Filled 2017-10-03: qty 5
  Filled 2017-10-03: qty 10

## 2017-10-03 MED ORDER — FENTANYL 2.5 MCG/ML BUPIVACAINE 1/10 % EPIDURAL INFUSION (WH - ANES)
14.0000 mL/h | INTRAMUSCULAR | Status: DC | PRN
  Administered 2017-10-03: 14 mL/h via EPIDURAL
  Filled 2017-10-03: qty 100

## 2017-10-03 NOTE — Anesthesia Preprocedure Evaluation (Signed)

## 2017-10-03 NOTE — H&P (Signed)
21 y.o. 7354w1d  G2P1001 comes in c/o labor.  Otherwise has good fetal movement and no bleeding.  Past Medical History:  Diagnosis Date  . Medical history non-contributory     Past Surgical History:  Procedure Laterality Date  . CHOLECYSTECTOMY  2015    OB History  Gravida Para Term Preterm AB Living  2 1 1     1   SAB TAB Ectopic Multiple Live Births        0 1    # Outcome Date GA Lbr Len/2nd Weight Sex Delivery Anes PTL Lv  2 Current           1 Term 12/02/15 5554w1d 15:12 / 01:08 8 lb 1.1 oz (3.66 kg) F Vag-Spont EPI  LIV      Social History   Socioeconomic History  . Marital status: Married    Spouse name: Not on file  . Number of children: Not on file  . Years of education: Not on file  . Highest education level: Not on file  Social Needs  . Financial resource strain: Not on file  . Food insecurity - worry: Not on file  . Food insecurity - inability: Not on file  . Transportation needs - medical: Not on file  . Transportation needs - non-medical: Not on file  Occupational History  . Not on file  Tobacco Use  . Smoking status: Never Smoker  . Smokeless tobacco: Never Used  Substance and Sexual Activity  . Alcohol use: No  . Drug use: No  . Sexual activity: Yes    Birth control/protection: None  Other Topics Concern  . Not on file  Social History Narrative  . Not on file   Morphine and related    Prenatal Transfer Tool  Maternal Diabetes: No Genetic Screening: Normal Maternal Ultrasounds/Referrals: Normal Fetal Ultrasounds or other Referrals:  None Maternal Substance Abuse:  No Significant Maternal Medications:  None Significant Maternal Lab Results: None  Other PNC: uncomplicated.    Vitals:   10/03/17 1155 10/03/17 1200  BP: 115/66 116/65  Pulse: 69 83  Resp: 16 16  Temp:    SpO2: 94%      Lungs/Cor:  NAD Abdomen:  soft, gravid Ex:  no cords, erythema SVE:  9/100/-2 BBOW FHTs:  130s, good STV, NST R Toco:  q 2-3   A/P   Term labor.    GBS neg.  Luis Sami A

## 2017-10-03 NOTE — MAU Note (Signed)
Pt presents with c/o regular ctxs since 0200 this morning.  Denies LOF, reports bloody show.  Reports FM.   VE in office yesterday 2cm.

## 2017-10-03 NOTE — Anesthesia Procedure Notes (Signed)
Epidural Patient location during procedure: OB  Staffing Anesthesiologist: Marializ Ferrebee, MD Performed: anesthesiologist   Preanesthetic Checklist Completed: patient identified, site marked, surgical consent, pre-op evaluation, timeout performed, IV checked, risks and benefits discussed and monitors and equipment checked  Epidural Patient position: sitting Prep: DuraPrep Patient monitoring: heart rate, continuous pulse ox and blood pressure Approach: midline Location: L3-L4 Injection technique: LOR saline  Needle:  Needle type: Tuohy  Needle gauge: 17 G Needle length: 9 cm and 9 Needle insertion depth: 5 cm Catheter type: closed end flexible Catheter size: 20 Guage Catheter at skin depth: 9 cm Test dose: negative  Assessment Events: blood not aspirated, injection not painful, no injection resistance, negative IV test and no paresthesia  Additional Notes Patient identified. Risks/Benefits/Options discussed with patient including but not limited to bleeding, infection, nerve damage, paralysis, failed block, incomplete pain control, headache, blood pressure changes, nausea, vomiting, reactions to medication both or allergic, itching and postpartum back pain. Confirmed with bedside nurse the patient's most recent platelet count. Confirmed with patient that they are not currently taking any anticoagulation, have any bleeding history or any family history of bleeding disorders. Patient expressed understanding and wished to proceed. All questions were answered. Sterile technique was used throughout the entire procedure. Please see nursing notes for vital signs. Test dose was given through epidural needle and negative prior to continuing to dose epidural or start infusion. Warning signs of high block given to the patient including shortness of breath, tingling/numbness in hands, complete motor block, or any concerning symptoms with instructions to call for help. Patient was given  instructions on fall risk and not to get out of bed. All questions and concerns addressed with instructions to call with any issues.     

## 2017-10-03 NOTE — MAU Note (Signed)
Pt states no history of HSV and GBS neg

## 2017-10-03 NOTE — Lactation Note (Signed)
This note was copied from a baby's chart. Lactation Consultation Note  Patient Name: Christine Alexander VHQIO'NToday's Date: 10/03/2017 Reason for consult: Initial assessment;Term P2 Pumped and bottle fed for 4 weeks with first.  Newborn is 7 hours old and currently latched onto breast well.  Good swallows heard.  Instructed to feed with cues and call for assist prn.  Breastfeeding consultation services and support information given and reviewed.  Maternal Data Does the patient have breastfeeding experience prior to this delivery?: Yes  Feeding Feeding Type: Breast Fed  LATCH Score Latch: Grasps breast easily, tongue down, lips flanged, rhythmical sucking.  Audible Swallowing: Spontaneous and intermittent  Type of Nipple: Everted at rest and after stimulation  Comfort (Breast/Nipple): Soft / non-tender  Hold (Positioning): No assistance needed to correctly position infant at breast.  LATCH Score: 10  Interventions Interventions: Breast feeding basics reviewed  Lactation Tools Discussed/Used     Consult Status Consult Status: Follow-up Date: 10/04/17 Follow-up type: In-patient    Huston FoleyMOULDEN, Glenda Kunst S 10/03/2017, 10:07 PM

## 2017-10-03 NOTE — Anesthesia Pain Management Evaluation Note (Signed)
  CRNA Pain Management Visit Note  Patient: Christine Alexander, 21 y.o., female  "Hello I am a [facility] of the anesthesia team at [facility]. We have an anesthesia team available at all times to provide care throughout the hospital, including epidural management and anesthesia for C-section. I don't know your plan for the delivery whether it a natural birth, water birth, IV sedation, nitrous supplementation, doula or epidural, but we want to meet your pain goals."   1.Was your pain managed to your expectations on prior hospitalizations?   Yes   2.What is your expectation for pain management during this hospitalization?     Epidural  3.How can we help you reach that goal? epidural  Record the patient's initial score and the patient's pain goal.   Pain: 0  Pain Goal: 3 [facility] wants you to be able to say your pain was always managed very well.  Madison HickmanGREGORY,Christine Alexander 10/03/2017

## 2017-10-04 ENCOUNTER — Inpatient Hospital Stay (HOSPITAL_COMMUNITY): Payer: Medicaid Other

## 2017-10-04 LAB — CBC
HCT: 34.6 % — ABNORMAL LOW (ref 36.0–46.0)
Hemoglobin: 12 g/dL (ref 12.0–15.0)
MCH: 29.8 pg (ref 26.0–34.0)
MCHC: 34.7 g/dL (ref 30.0–36.0)
MCV: 85.9 fL (ref 78.0–100.0)
Platelets: 176 10*3/uL (ref 150–400)
RBC: 4.03 MIL/uL (ref 3.87–5.11)
RDW: 14.1 % (ref 11.5–15.5)
WBC: 11.7 10*3/uL — ABNORMAL HIGH (ref 4.0–10.5)

## 2017-10-04 LAB — RPR: RPR Ser Ql: NONREACTIVE

## 2017-10-04 MED ORDER — RHO D IMMUNE GLOBULIN 1500 UNIT/2ML IJ SOSY
300.0000 ug | PREFILLED_SYRINGE | Freq: Once | INTRAMUSCULAR | Status: AC
  Administered 2017-10-04: 300 ug via INTRAVENOUS
  Filled 2017-10-04: qty 2

## 2017-10-04 NOTE — Discharge Summary (Signed)
Obstetric Discharge Summary Reason for Admission: onset of labor Prenatal Procedures: none Intrapartum Procedures: spontaneous vaginal delivery Postpartum Procedures: Rho(D) Ig Complications-Operative and Postpartum: none Hemoglobin  Date Value Ref Range Status  10/04/2017 12.0 12.0 - 15.0 g/dL Final  16/10/960403/28/2018 54.013.0 11.1 - 15.9 g/dL Final   HCT  Date Value Ref Range Status  10/04/2017 34.6 (L) 36.0 - 46.0 % Final   Hematocrit  Date Value Ref Range Status  01/07/2017 39.1 34.0 - 46.6 % Final     Discharge Diagnoses: Term Pregnancy-delivered  Discharge Information: Date: 10/04/2017 Activity: pelvic rest Diet: routine Medications: Ibuprofen Condition: stable Instructions: refer to practice specific booklet Discharge to: home Follow-up Information    Carrington ClampHorvath, Tymier Lindholm, MD Follow up in 4 week(s).   Specialty:  Obstetrics and Gynecology Contact information: [address] [place] [id number] [phone]           Newborn Data: Live born female  Birth Weight: 7 lb 8.8 oz (3425 g) APGAR: 9, 9  Newborn Delivery   Birth date/time:  10/03/2017 14:21:00 Delivery type:  Vaginal, Spontaneous     Home with mother.  Tayna Smethurst A 10/04/2017, 9:01 AM

## 2017-10-04 NOTE — Progress Notes (Signed)
Patient is eating, ambulating, voiding.  Pain control is good.  Vitals:   10/03/17 1832 10/03/17 1946 10/03/17 2243 10/04/17 0607  BP: 108/74 126/63 115/69   Pulse: 94 92 84   Resp:  18 18 18   Temp: 98.9 F (37.2 C) 98.4 F (36.9 C) 98.4 F (36.9 C) 98.4 F (36.9 C)  TempSrc: Oral Oral Oral Oral  SpO2:   100% 99%  Weight:      Height:        Fundus firm Perineum without swelling.  Lab Results  Component Value Date   WBC 11.7 (H) 10/04/2017   HGB 12.0 10/04/2017   HCT 34.6 (L) 10/04/2017   MCV 85.9 10/04/2017   PLT 176 10/04/2017    --/--/O NEG (12/22 1037)/RI  A/P Post partum day 1.  Baby is RH POS- pt needs Rhogam. Routine care.  Expect d/c routine.    Jaxxen Voong A

## 2017-10-04 NOTE — Anesthesia Postprocedure Evaluation (Signed)
Anesthesia Post Note  Patient: Christine Alexander  Procedure(s) Performed: AN AD HOC LABOR EPIDURAL     Patient location during evaluation: Mother Baby Anesthesia Type: Epidural Level of consciousness: awake and alert and oriented Pain management: pain level controlled Vital Signs Assessment: post-procedure vital signs reviewed and stable Respiratory status: spontaneous breathing and nonlabored ventilation Cardiovascular status: stable Postop Assessment: no headache, patient able to bend at knees, no backache, no apparent nausea or vomiting, epidural receding and adequate PO intake Anesthetic complications: no    Last Vitals:  Vitals:   10/03/17 2243 10/04/17 0607  BP: 115/69   Pulse: 84   Resp: 18 18  Temp: 36.9 C 36.9 C  SpO2: 100% 99%    Last Pain:  Vitals:   10/04/17 0607  TempSrc: Oral  PainSc:    Pain Goal: Patients Stated Pain Goal: 5 (10/03/17 1036)               Jadah Bobak Hristova

## 2017-10-05 LAB — RH IG WORKUP (INCLUDES ABO/RH)
ABO/RH(D): O NEG
FETAL SCREEN: NEGATIVE
GESTATIONAL AGE(WKS): 40.1
Unit division: 0

## 2018-02-04 ENCOUNTER — Ambulatory Visit (INDEPENDENT_AMBULATORY_CARE_PROVIDER_SITE_OTHER): Payer: BLUE CROSS/BLUE SHIELD | Admitting: Pediatrics

## 2018-02-04 ENCOUNTER — Encounter: Payer: Self-pay | Admitting: Pediatrics

## 2018-02-04 VITALS — BP 115/73 | HR 72 | Temp 97.9°F | Ht 63.0 in | Wt 208.0 lb

## 2018-02-04 DIAGNOSIS — Z3009 Encounter for other general counseling and advice on contraception: Secondary | ICD-10-CM

## 2018-02-04 DIAGNOSIS — F339 Major depressive disorder, recurrent, unspecified: Secondary | ICD-10-CM

## 2018-02-04 MED ORDER — SERTRALINE HCL 50 MG PO TABS: 50.0000 mg | ORAL_TABLET | Freq: Every day | ORAL | 1 refills | Status: DC

## 2018-02-04 NOTE — Progress Notes (Signed)
  Subjective:   Patient ID: Christine Alexander, female    DOB: 06/24/1996, 22 y.o.   MRN: [id number] CC: Depression  HPI: Christine Alexander is a 22 y.o. female presenting for Depression  Sleeping well.  Mood has been down.  Crying unexpectedly.  Feels able to take care of her 1924-month-old and 24-year-old at home.  No thoughts of self-harm or harm to baby.  Similar symptoms after last pregnancy.  Was started on fluoxetine at that time, took for 3 months with much improvement in symptoms.  She been on fluoxetine at the start of her marriage as well with improvement in symptoms.  Taking progesterone only birth control right now.  Also using condoms regularly.  Breast-feeding.  Relevant past medical, surgical, family and social history reviewed. Allergies and medications reviewed and updated. Social History   Tobacco Use  Smoking Status Never Smoker  Smokeless Tobacco Never Used   ROS: Per HPI   Objective:    BP 115/73   Pulse 72   Temp 97.9 F (36.6 C) (Oral)   Ht 5\' 3"  (1.6 m)   Wt 208 lb (94.3 kg)   BMI 36.85 kg/m   Wt Readings from Last 3 Encounters:  02/04/18 208 lb (94.3 kg)  10/03/17 221 lb 8 oz (100.5 kg)  01/30/17 195 lb 6.4 oz (88.6 kg)    Gen: NAD, alert, cooperative with exam, NCAT EYES: EOMI, no conjunctival injection, or no icterus CV: NRRR, normal S1/S2, no murmur, distal pulses 2+ b/l Resp: CTABL, no wheezes, normal WOB Abd: +BS, soft, NTND. no guarding or organomegaly Ext: No edema, warm Neuro: Alert and oriented, strength equal b/l UE and LE, coordination grossly normal MSK: normal muscle bulk Psych: Normal affect.  Tearful at times.  Assessment & Plan:  Christine Alexander was seen today for depression.  Diagnoses and all orders for this visit:  Birth control counseling Patient going to think about IUD.  Not taking Micronor same time every day.  Has been using condoms every time as back-up.  Depression, recurrent (HCC) Start below, take half tab for 2 weeks then take full  tab.  Any worsening symptoms let me know.  Any change in baby behavior let me know.  Patient declines counseling at this time. -     sertraline (ZOLOFT) 50 MG tablet; Take 1 tablet (50 mg total) by mouth daily.   Follow up plan: Return in about 1 month (around 03/04/2018). Rex Krasarol Bobbijo Holst, MD [facility]

## 2018-02-04 NOTE — Patient Instructions (Addendum)
Triple P Parenting Free Online positive parenting resource for [place] parents, from toddlers to teens: https://www.triplep-parenting.com/  [facility] [place]: [employer] [facility] [phone]  [employer] [address] [phone]  [employer] Program [place]: [phone] If you have any trouble let me know, I can also sign you up if you want.

## 2018-03-10 ENCOUNTER — Ambulatory Visit: Payer: Medicaid Other | Admitting: Pediatrics

## 2018-04-28 DIAGNOSIS — Z6834 Body mass index (BMI) 34.0-34.9, adult: Secondary | ICD-10-CM | POA: Diagnosis not present

## 2018-04-28 DIAGNOSIS — N926 Irregular menstruation, unspecified: Secondary | ICD-10-CM | POA: Diagnosis not present

## 2018-05-07 ENCOUNTER — Encounter: Payer: Self-pay | Admitting: Pediatrics

## 2018-05-07 ENCOUNTER — Ambulatory Visit (INDEPENDENT_AMBULATORY_CARE_PROVIDER_SITE_OTHER): Payer: BLUE CROSS/BLUE SHIELD | Admitting: Pediatrics

## 2018-05-07 VITALS — BP 115/75 | HR 78 | Temp 97.7°F | Ht 63.0 in | Wt 197.4 lb

## 2018-05-07 DIAGNOSIS — M5441 Lumbago with sciatica, right side: Secondary | ICD-10-CM

## 2018-05-07 DIAGNOSIS — G8929 Other chronic pain: Secondary | ICD-10-CM

## 2018-05-07 NOTE — Patient Instructions (Signed)
Back Exercises If you have pain in your back, do these exercises 2-3 times each day or as told by your doctor. When the pain goes away, do the exercises once each day, but repeat the steps more times for each exercise (do more repetitions). If you do not have pain in your back, do these exercises once each day or as told by your doctor. Exercises Single Knee to Chest  Do these steps 3-5 times in a row for each leg: 1. Lie on your back on a firm bed or the floor with your legs stretched out. 2. Bring one knee to your chest. 3. Hold your knee to your chest by grabbing your knee or thigh. 4. Pull on your knee until you feel a gentle stretch in your lower back. 5. Keep doing the stretch for 10-30 seconds. 6. Slowly let go of your leg and straighten it.  Pelvic Tilt  Do these steps 5-10 times in a row: 1. Lie on your back on a firm bed or the floor with your legs stretched out. 2. Bend your knees so they point up to the ceiling. Your feet should be flat on the floor. 3. Tighten your lower belly (abdomen) muscles to press your lower back against the floor. This will make your tailbone point up to the ceiling instead of pointing down to your feet or the floor. 4. Stay in this position for 5-10 seconds while you gently tighten your muscles and breathe evenly.  Cat-Cow  Do these steps until your lower back bends more easily: 1. Get on your hands and knees on a firm surface. Keep your hands under your shoulders, and keep your knees under your hips. You may put padding under your knees. 2. Let your head hang down, and make your tailbone point down to the floor so your lower back is round like the back of a cat. 3. Stay in this position for 5 seconds. 4. Slowly lift your head and make your tailbone point up to the ceiling so your back hangs low (sags) like the back of a cow. 5. Stay in this position for 5 seconds.  Press-Ups  Do these steps 5-10 times in a row: 1. Lie on your belly (face-down)  on the floor. 2. Place your hands near your head, about shoulder-width apart. 3. While you keep your back relaxed and keep your hips on the floor, slowly straighten your arms to raise the top half of your body and lift your shoulders. Do not use your back muscles. To make yourself more comfortable, you may change where you place your hands. 4. Stay in this position for 5 seconds. 5. Slowly return to lying flat on the floor.  Bridges  Do these steps 10 times in a row: 1. Lie on your back on a firm surface. 2. Bend your knees so they point up to the ceiling. Your feet should be flat on the floor. 3. Tighten your butt muscles and lift your butt off of the floor until your waist is almost as high as your knees. If you do not feel the muscles working in your butt and the back of your thighs, slide your feet 1-2 inches farther away from your butt. 4. Stay in this position for 3-5 seconds. 5. Slowly lower your butt to the floor, and let your butt muscles relax.  If this exercise is too easy, try doing it with your arms crossed over your chest. Back Lifts Do these steps 5-10 times in a   row: 1. Lie on your belly (face-down) with your arms at your sides, and rest your forehead on the floor. 2. Tighten the muscles in your legs and your butt. 3. Slowly lift your chest off of the floor while you keep your hips on the floor. Keep the back of your head in line with the curve in your back. Look at the floor while you do this. 4. Stay in this position for 3-5 seconds. 5. Slowly lower your chest and your face to the floor.  Contact a doctor if:  Your back pain gets a lot worse when you do an exercise.  Your back pain does not lessen 2 hours after you exercise. If you have any of these problems, stop doing the exercises. Do not do them again unless your doctor says it is okay. Get help right away if:  You have sudden, very bad back pain. If this happens, stop doing the exercises. Do not do them again  unless your doctor says it is okay. This information is not intended to replace advice given to you by your health care provider. Make sure you discuss any questions you have with your health care provider. Document Released: 11/01/2010 Document Revised: 03/06/2016 Document Reviewed: 11/23/2014 Elsevier Interactive Patient Education  2018 Elsevier Inc.   

## 2018-05-07 NOTE — Progress Notes (Signed)
  Subjective:   Patient ID: Christine Alexander, female    DOB: April 27, 1996, 22 y.o.   MRN: [id number] CC: Back Pain  HPI: Christine Alexander is a 22 y.o. female   Started having back pain soon after her 24-year-old was born.  She has flares that come and go.  She has 24-month-old and back pain got worse again towards the end of that pregnancy.  4 months ago she started going to the gym, doing cardio.  No weight lifting.  Back pain was getting worse so she has not been for the last 2 months.  She has been successful at losing some weight with lifestyle changes including 10 pounds over the last 3 months which she has been pleased about, but is not been helpful for her back pain.   When she lifts 1 of her children, she will get spasms in her lower back, sometimes one side sometimes the other side.  She is tried massage, heat, topical over-the-counter medicines with minimal relief.  She does have pain that goes from the right side down into her right foot off and on.  She says her insurance does not cover physical therapy.  She is still breast-feeding.  Relevant past medical, surgical, family and social history reviewed. Allergies and medications reviewed and updated. Social History   Tobacco Use  Smoking Status Never Smoker  Smokeless Tobacco Never Used   ROS: Per HPI   Objective:    BP 115/75   Pulse 78   Temp 97.7 F (36.5 C) (Oral)   Ht 5\' 3"  (1.6 m)   Wt 197 lb 6.4 oz (89.5 kg)   BMI 34.97 kg/m   Wt Readings from Last 3 Encounters:  05/07/18 197 lb 6.4 oz (89.5 kg)  02/04/18 208 lb (94.3 kg)  10/03/17 221 lb 8 oz (100.5 kg)    Gen: NAD, alert, cooperative with exam, NCAT EYES: EOMI, no conjunctival injection, or no icterus CV: NRRR, normal S1/S2, no murmur, distal pulses 2+ b/l Resp: CTABL, no wheezes, normal WOB Abd: +BS, soft, NTND. no guarding or organomegaly Ext: No edema, warm Neuro: Alert and oriented, strength equal b/l UE and LE, coordination grossly normal MSK: No point  tenderness over spine.  Mildly tender to palpation right lower back paraspinal muscles.  Assessment & Plan:  [name] was seen today for back pain, ongoing for several months, worsened over the last couple months.  No red flag symptoms.  Recommend ice, heat, NSAIDs, okay to take 600 mg ibuprofen 3 times a day.  Avoid lifting a 24-year-old as much as possible.  Recommended physical therapy, patient wants to wait his insurance does not cover it.  Gave gentle back exercises for use at home.    Diagnoses and all orders for this visit:  Chronic right-sided low back pain with right-sided sciatica -     Ambulatory referral to Orthopedic Surgery   Follow up plan: 4 weeks. Rex Krasarol Vincent, MD [facility]

## 2018-09-02 ENCOUNTER — Ambulatory Visit (INDEPENDENT_AMBULATORY_CARE_PROVIDER_SITE_OTHER): Payer: BLUE CROSS/BLUE SHIELD

## 2018-09-02 DIAGNOSIS — Z23 Encounter for immunization: Secondary | ICD-10-CM | POA: Diagnosis not present

## 2018-09-16 DIAGNOSIS — H04123 Dry eye syndrome of bilateral lacrimal glands: Secondary | ICD-10-CM | POA: Diagnosis not present

## 2018-09-16 DIAGNOSIS — H40033 Anatomical narrow angle, bilateral: Secondary | ICD-10-CM | POA: Diagnosis not present

## 2018-09-16 DIAGNOSIS — H5213 Myopia, bilateral: Secondary | ICD-10-CM | POA: Diagnosis not present

## 2018-09-30 ENCOUNTER — Encounter: Payer: Self-pay | Admitting: Pediatrics

## 2018-09-30 ENCOUNTER — Ambulatory Visit: Payer: BLUE CROSS/BLUE SHIELD | Admitting: Pediatrics

## 2018-09-30 ENCOUNTER — Ambulatory Visit (INDEPENDENT_AMBULATORY_CARE_PROVIDER_SITE_OTHER): Payer: BLUE CROSS/BLUE SHIELD | Admitting: Pediatrics

## 2018-09-30 VITALS — BP 117/84 | HR 82 | Temp 97.6°F | Ht 63.0 in | Wt 190.6 lb

## 2018-09-30 DIAGNOSIS — R109 Unspecified abdominal pain: Secondary | ICD-10-CM

## 2018-09-30 DIAGNOSIS — Z6833 Body mass index (BMI) 33.0-33.9, adult: Secondary | ICD-10-CM

## 2018-09-30 DIAGNOSIS — R1013 Epigastric pain: Secondary | ICD-10-CM | POA: Diagnosis not present

## 2018-09-30 MED ORDER — FAMOTIDINE 20 MG PO TABS: 20.0000 mg | ORAL_TABLET | Freq: Two times a day (BID) | ORAL | 1 refills | Status: DC

## 2018-09-30 NOTE — Progress Notes (Signed)
  Subjective:   Patient ID: Christine Alexander, female    DOB: 12/12/1995, 22 y.o.   MRN: [id number] CC: Abdominal Pain (Lower right)  HPI: Christine Alexander is a 22 y.o. female   For about the last month has had random sharp pains that happen at different places in her belly.  Often comes in with activity, walking or bending over to pick something up and straightening back up.  Pain is sharp and takes her breath away.  When walking if she continues walking it usually goes away after 10 to 15 minutes.  No fevers.  Appetite has been normal.  She has been doing intermittent fasting on the keto diet.  Has cut out junk food.  Eating mostly plant-based diet.  Has lost over 20 pounds in the last few months.  Feeling well overall other than the stomach pains.  She is having regular bowel movements.  No pushing or straining, no hard stools.  No loose stools.  She does feel more bloated after eating, her close will fit fine until a meal, then they will be very tight and she will feel bloated in her abdomen.  She had her gallbladder taken out 3 to 4 years ago.  Relevant past medical, surgical, family and social history reviewed. Allergies and medications reviewed and updated. Social History   Tobacco Use  Smoking Status Never Smoker  Smokeless Tobacco Never Used   ROS: Per HPI   Objective:    BP 117/84   Pulse 82   Temp 97.6 F (36.4 C) (Oral)   Ht '5\' 3"'$  (1.6 m)   Wt 190 lb 9.6 oz (86.5 kg)   BMI 33.76 kg/m   Wt Readings from Last 3 Encounters:  09/30/18 190 lb 9.6 oz (86.5 kg)  05/07/18 197 lb 6.4 oz (89.5 kg)  02/04/18 208 lb (94.3 kg)    Gen: NAD, alert, cooperative with exam, NCAT EYES: EOMI, no conjunctival injection, or no icterus ENT:  TMs pearly gray b/l, OP without erythema LYMPH: no cervical LAD CV: NRRR, normal S1/S2, no murmur, distal pulses 2+ b/l Resp: CTABL, no wheezes, normal WOB Abd: +BS, soft, NTND. no guarding or organomegaly Ext: No edema, warm Neuro: Alert and oriented,  strength equal b/l UE and LE, coordination grossly normal MSK: normal muscle bulk  Assessment & Plan:  Christine Alexander was seen today for abdominal pain.  Diagnoses and all orders for this visit:  Abdominal pain, unspecified abdominal location Pain comes on randomly, often worse with exertion.  Will check labs.  Start famotidine.  Discussed avoiding fasting for periods of time, could make stomach irritation worse.  Small, more frequent meals okay.  Do trial famotidine.  Any worsening let me know. -     CMP14+EGFR -     CBC with Differential/Platelet  Epigastric pain -     famotidine (PEPCID) 20 MG tablet; Take 1 tablet (20 mg total) by mouth 2 (two) times daily.  BMI 33.0-33.9,adult Has made a lot of lifestyle changes, diet changes over the last few months.  Weight down about 20 pounds.   Follow up plan: Return in about 3 weeks (around 10/21/2018). Christine Found, MD [place]

## 2018-10-01 LAB — CBC WITH DIFFERENTIAL/PLATELET
Basophils Absolute: 0.1 x10E3/uL (ref 0.0–0.2)
Basos: 1 %
EOS (ABSOLUTE): 0.3 x10E3/uL (ref 0.0–0.4)
Eos: 3 %
Hematocrit: 40.6 % (ref 34.0–46.6)
Hemoglobin: 13.6 g/dL (ref 11.1–15.9)
Immature Grans (Abs): 0 x10E3/uL (ref 0.0–0.1)
Immature Granulocytes: 0 %
Lymphocytes Absolute: 3.1 x10E3/uL (ref 0.7–3.1)
Lymphs: 34 %
MCH: 27.6 pg (ref 26.6–33.0)
MCHC: 33.5 g/dL (ref 31.5–35.7)
MCV: 82 fL (ref 79–97)
Monocytes Absolute: 0.5 x10E3/uL (ref 0.1–0.9)
Monocytes: 5 %
Neutrophils Absolute: 5.2 x10E3/uL (ref 1.4–7.0)
Neutrophils: 57 %
Platelets: 306 x10E3/uL (ref 150–450)
RBC: 4.93 x10E6/uL (ref 3.77–5.28)
RDW: 13.1 % (ref 12.3–15.4)
WBC: 9.1 x10E3/uL (ref 3.4–10.8)

## 2018-10-01 LAB — CMP14+EGFR
ALT: 39 IU/L — ABNORMAL HIGH (ref 0–32)
AST: 20 IU/L (ref 0–40)
Albumin/Globulin Ratio: 2 (ref 1.2–2.2)
Albumin: 5 g/dL (ref 3.5–5.5)
Alkaline Phosphatase: 103 IU/L (ref 39–117)
BUN/Creatinine Ratio: 14 (ref 9–23)
BUN: 8 mg/dL (ref 6–20)
Bilirubin Total: 1 mg/dL (ref 0.0–1.2)
CO2: 21 mmol/L (ref 20–29)
Calcium: 9.7 mg/dL (ref 8.7–10.2)
Chloride: 100 mmol/L (ref 96–106)
Creatinine, Ser: 0.59 mg/dL (ref 0.57–1.00)
GFR calc Af Amer: 148 mL/min/1.73 (ref >59)
GFR calc non Af Amer: 129 mL/min/1.73 (ref >59)
Globulin, Total: 2.5 g/dL (ref 1.5–4.5)
Glucose: 82 mg/dL (ref 65–99)
Potassium: 4 mmol/L (ref 3.5–5.2)
Sodium: 140 mmol/L (ref 134–144)
Total Protein: 7.5 g/dL (ref 6.0–8.5)

## 2018-10-04 ENCOUNTER — Telehealth: Payer: Self-pay | Admitting: *Deleted

## 2018-10-04 ENCOUNTER — Encounter: Payer: Self-pay | Admitting: Pediatrics

## 2018-10-04 ENCOUNTER — Ambulatory Visit (INDEPENDENT_AMBULATORY_CARE_PROVIDER_SITE_OTHER): Payer: BLUE CROSS/BLUE SHIELD | Admitting: Pediatrics

## 2018-10-04 ENCOUNTER — Ambulatory Visit (INDEPENDENT_AMBULATORY_CARE_PROVIDER_SITE_OTHER): Payer: BLUE CROSS/BLUE SHIELD

## 2018-10-04 VITALS — BP 129/76 | HR 83 | Temp 98.2°F | Ht 63.0 in | Wt 193.2 lb

## 2018-10-04 DIAGNOSIS — R1084 Generalized abdominal pain: Secondary | ICD-10-CM | POA: Diagnosis not present

## 2018-10-04 DIAGNOSIS — K219 Gastro-esophageal reflux disease without esophagitis: Secondary | ICD-10-CM

## 2018-10-04 LAB — MICROSCOPIC EXAMINATION: RENAL EPITHEL UA: NONE SEEN /HPF

## 2018-10-04 LAB — URINALYSIS, COMPLETE
Bilirubin, UA: NEGATIVE
Glucose, UA: NEGATIVE
Ketones, UA: NEGATIVE
Nitrite, UA: NEGATIVE
PH UA: 6.5 (ref 5.0–7.5)
Protein, UA: NEGATIVE
RBC UA: NEGATIVE
Specific Gravity, UA: 1.015 (ref 1.005–1.030)
Urobilinogen, Ur: 0.2 mg/dL (ref 0.2–1.0)

## 2018-10-04 LAB — PREGNANCY, URINE: Preg Test, Ur: NEGATIVE

## 2018-10-04 MED ORDER — PANTOPRAZOLE SODIUM 40 MG PO TBEC
40.0000 mg | DELAYED_RELEASE_TABLET | Freq: Every day | ORAL | 3 refills | Status: DC
Start: 2018-10-04 — End: 2019-01-28

## 2018-10-04 NOTE — Telephone Encounter (Signed)
Pt says her belly button is red and swollen and she has noticed blood in stool. Please advise.

## 2018-10-04 NOTE — Telephone Encounter (Signed)
Pt is being seen now

## 2018-10-04 NOTE — Telephone Encounter (Signed)
Needs to be seen, can be put on my schedule, can she come in now?

## 2018-10-04 NOTE — Progress Notes (Signed)
Subjective:   Patient ID: Christine Alexander, female    DOB: 04/25/1996, 22 y.o.   MRN: [id number] CC: Rectal Bleeding; Edema (Belly button); Nausea; Abdominal Pain; and Bloated  HPI: Christine Alexander is a 22 y.o. female   Seen last week for abdominal pain.  Back today with worsening pain.  Now much worse with food.  She is been feeling lightheaded, nauseous off and on.  Last bowel movement was this morning, soft with some red/blood appearing material on surface.  She has intermittently been having bloody stools.  Over the last 2 to 3 months she has had several black stools with blood on the surface, turns the toilet bowl red.  No diarrhea.  Stools are soft, no constipation, does not have to push and strain to pass stool.  Cleaning bellybutton yesterday the skin was irritated, there is some moisture inside the bellybutton.  Continues to feel very bloated after eating.  Prescribed Pepcid last week, has not yet picked it up, supposed to be ready at the pharmacy today.  Has been having regular periods.  Last menstrual period 09/23/2018.  Taking birth control regularly.  Most recent period was quite light, did not need a regular pad as she usually does.  No fevers.  Relevant past medical, surgical, family and social history reviewed. Allergies and medications reviewed and updated. Social History   Tobacco Use  Smoking Status Never Smoker  Smokeless Tobacco Never Used   ROS: Per HPI   Objective:    BP 129/76   Pulse 83   Temp 98.2 F (36.8 C) (Oral)   Ht 5' 3"  (1.6 m)   Wt 193 lb 3.2 oz (87.6 kg)   BMI 34.22 kg/m   Wt Readings from Last 3 Encounters:  10/04/18 193 lb 3.2 oz (87.6 kg)  09/30/18 190 lb 9.6 oz (86.5 kg)  05/07/18 197 lb 6.4 oz (89.5 kg)    Gen: NAD, alert, cooperative with exam, NCAT EYES: EOMI, no conjunctival injection, or no icterus ENT:  OP without erythema CV: NRRR, normal S1/S2, no murmur, distal pulses 2+ b/l Resp: CTABL, no wheezes, normal WOB Abd: +BS, soft,  NTND, neg murphy, no guarding or organomegaly Ext: No edema, warm Neuro: Alert and oriented, strength equal b/l UE and LE, coordination grossly normal Skin: slightly red, irriated appearing skin within umbilicus  Assessment & Plan:  Christine Alexander was seen today for rectal bleeding, edema, nausea, abdominal pain and bloated.  Diagnoses and all orders for this visit:  Generalized abdominal pain Xray with moderate amount of stool in colon. Rec taking miralax dialy to see if helps with abd pain symptoms. Start PPI as below. Given blood in stool, will schedule appt with GI. No abd pain now. Any worsening symptoms let us know, may need further imaging. -     CBC with Differential/Platelet -     CMP14+EGFR -     Ferritin -     Sedimentation rate -     C-reactive protein -     Pregnancy, urine -     Ambulatory referral to Gastroenterology -     DG Abd 1 View; Future -     Urinalysis, Complete  Gastroesophageal reflux disease, esophagitis presence not specified -     pantoprazole (PROTONIX) 40 MG tablet; Take 1 tablet (40 mg total) by mouth daily.  Other orders -     Microscopic Examination   Follow up plan: Return if symptoms worsen or fail to improve. Assunta Found, MD [place]

## 2018-10-05 LAB — CBC WITH DIFFERENTIAL/PLATELET
BASOS: 1 %
Basophils Absolute: 0.1 10*3/uL (ref 0.0–0.2)
EOS (ABSOLUTE): 0.4 10*3/uL (ref 0.0–0.4)
EOS: 6 %
HEMOGLOBIN: 13.4 g/dL (ref 11.1–15.9)
Hematocrit: 39.8 % (ref 34.0–46.6)
IMMATURE GRANS (ABS): 0 10*3/uL (ref 0.0–0.1)
IMMATURE GRANULOCYTES: 0 %
LYMPHS: 37 %
Lymphocytes Absolute: 2.7 10*3/uL (ref 0.7–3.1)
MCH: 28.7 pg (ref 26.6–33.0)
MCHC: 33.7 g/dL (ref 31.5–35.7)
MCV: 85 fL (ref 79–97)
MONOCYTES: 7 %
MONOS ABS: 0.5 10*3/uL (ref 0.1–0.9)
NEUTROS PCT: 49 %
Neutrophils Absolute: 3.5 10*3/uL (ref 1.4–7.0)
Platelets: 270 10*3/uL (ref 150–450)
RBC: 4.67 x10E6/uL (ref 3.77–5.28)
RDW: 13.4 % (ref 12.3–15.4)
WBC: 7.2 10*3/uL (ref 3.4–10.8)

## 2018-10-05 LAB — CMP14+EGFR
ALT: 42 IU/L — ABNORMAL HIGH (ref 0–32)
AST: 31 IU/L (ref 0–40)
Albumin/Globulin Ratio: 1.7 (ref 1.2–2.2)
Albumin: 4.5 g/dL (ref 3.5–5.5)
Alkaline Phosphatase: 100 IU/L (ref 39–117)
BUN/Creatinine Ratio: 14 (ref 9–23)
BUN: 8 mg/dL (ref 6–20)
Bilirubin Total: 0.5 mg/dL (ref 0.0–1.2)
CALCIUM: 9.2 mg/dL (ref 8.7–10.2)
CO2: 19 mmol/L — AB (ref 20–29)
CREATININE: 0.56 mg/dL — AB (ref 0.57–1.00)
Chloride: 106 mmol/L (ref 96–106)
GFR, EST AFRICAN AMERICAN: 151 mL/min/{1.73_m2} (ref >59)
GFR, EST NON AFRICAN AMERICAN: 131 mL/min/{1.73_m2} (ref >59)
Globulin, Total: 2.6 g/dL (ref 1.5–4.5)
Glucose: 88 mg/dL (ref 65–99)
Potassium: 4.4 mmol/L (ref 3.5–5.2)
SODIUM: 140 mmol/L (ref 134–144)
TOTAL PROTEIN: 7.1 g/dL (ref 6.0–8.5)

## 2018-10-05 LAB — C-REACTIVE PROTEIN: CRP: 1 mg/L (ref 0–10)

## 2018-10-05 LAB — SEDIMENTATION RATE: SED RATE: 2 mm/h (ref 0–32)

## 2018-10-05 LAB — FERRITIN: Ferritin: 131 ng/mL (ref 15–150)

## 2018-10-12 DIAGNOSIS — H5213 Myopia, bilateral: Secondary | ICD-10-CM | POA: Diagnosis not present

## 2018-10-26 ENCOUNTER — Encounter (INDEPENDENT_AMBULATORY_CARE_PROVIDER_SITE_OTHER): Payer: Self-pay | Admitting: Internal Medicine

## 2018-10-26 ENCOUNTER — Ambulatory Visit (INDEPENDENT_AMBULATORY_CARE_PROVIDER_SITE_OTHER): Payer: Medicaid Other | Admitting: Internal Medicine

## 2019-01-12 ENCOUNTER — Encounter: Payer: Self-pay | Admitting: Family Medicine

## 2019-01-12 ENCOUNTER — Ambulatory Visit (INDEPENDENT_AMBULATORY_CARE_PROVIDER_SITE_OTHER): Payer: BLUE CROSS/BLUE SHIELD | Admitting: Family Medicine

## 2019-01-12 ENCOUNTER — Telehealth: Payer: Self-pay | Admitting: Pediatrics

## 2019-01-12 ENCOUNTER — Other Ambulatory Visit: Payer: Self-pay

## 2019-01-12 DIAGNOSIS — L089 Local infection of the skin and subcutaneous tissue, unspecified: Secondary | ICD-10-CM

## 2019-01-12 DIAGNOSIS — K5904 Chronic idiopathic constipation: Secondary | ICD-10-CM | POA: Insufficient documentation

## 2019-01-12 MED ORDER — MUPIROCIN 2 % EX OINT: 1.0000 "application " | TOPICAL_OINTMENT | Freq: Two times a day (BID) | CUTANEOUS | 0 refills | Status: AC

## 2019-01-12 NOTE — Patient Instructions (Signed)
High-Fiber Diet  Fiber, also called dietary fiber, is a type of carbohydrate that is found in fruits, vegetables, whole grains, and beans. A high-fiber diet can have many health benefits. Your health care provider may recommend a high-fiber diet to help:   Prevent constipation. Fiber can make your bowel movements more regular.   Lower your cholesterol.   Relieve the following conditions:  ? Swelling of veins in the anus (hemorrhoids).  ? Swelling and irritation (inflammation) of specific areas of the digestive tract (uncomplicated diverticulosis).  ? A problem of the large intestine (colon) that sometimes causes pain and diarrhea (irritable bowel syndrome, IBS).   Prevent overeating as part of a weight-loss plan.   Prevent heart disease, type 2 diabetes, and certain cancers.  What is my plan?  The recommended daily fiber intake in grams (g) includes:   38 g for men age 50 or younger.   30 g for men over age 50.   25 g for women age 50 or younger.   21 g for women over age 50.  You can get the recommended daily intake of dietary fiber by:   Eating a variety of fruits, vegetables, grains, and beans.   Taking a fiber supplement, if it is not possible to get enough fiber through your diet.  What do I need to know about a high-fiber diet?   It is better to get fiber through food sources rather than from fiber supplements. There is not a lot of research about how effective supplements are.   Always check the fiber content on the nutrition facts label of any prepackaged food. Look for foods that contain 5 g of fiber or more per serving.   Talk with a diet and nutrition specialist (dietitian) if you have questions about specific foods that are recommended or not recommended for your medical condition, especially if those foods are not listed below.   Gradually increase how much fiber you consume. If you increase your intake of dietary fiber too quickly, you may have bloating, cramping, or gas.   Drink plenty  of water. Water helps you to digest fiber.  What are tips for following this plan?   Eat a wide variety of high-fiber foods.   Make sure that half of the grains that you eat each day are whole grains.   Eat breads and cereals that are made with whole-grain flour instead of refined flour or white flour.   Eat brown rice, bulgur wheat, or millet instead of white rice.   Start the day with a breakfast that is high in fiber, such as a cereal that contains 5 g of fiber or more per serving.   Use beans in place of meat in soups, salads, and pasta dishes.   Eat high-fiber snacks, such as berries, raw vegetables, nuts, and popcorn.   Choose whole fruits and vegetables instead of processed forms like juice or sauce.  What foods can I eat?    Fruits  Berries. Pears. Apples. Oranges. Avocado. Prunes and raisins. Dried figs.  Vegetables  Sweet potatoes. Spinach. Kale. Artichokes. Cabbage. Broccoli. Cauliflower. Green peas. Carrots. Squash.  Grains  Whole-grain breads. Multigrain cereal. Oats and oatmeal. Brown rice. Barley. Bulgur wheat. Millet. Quinoa. Bran muffins. Popcorn. Rye wafer crackers.  Meats and other proteins  Navy, kidney, and pinto beans. Soybeans. Split peas. Lentils. Nuts and seeds.  Dairy  Fiber-fortified yogurt.  Beverages  Fiber-fortified soy milk. Fiber-fortified orange juice.  Other foods  Fiber bars.  The items   listed above may not be a complete list of recommended foods and beverages. Contact a dietitian for more options.  What foods are not recommended?  Fruits  Fruit juice. Cooked, strained fruit.  Vegetables  Fried potatoes. Canned vegetables. Well-cooked vegetables.  Grains  White bread. Pasta made with refined flour. White rice.  Meats and other proteins  Fatty cuts of meat. Fried chicken or fried fish.  Dairy  Milk. Yogurt. Cream cheese. Sour cream.  Fats and oils  Butters.  Beverages  Soft drinks.  Other foods  Cakes and pastries.  The items listed above may not be a complete list of foods  and beverages to avoid. Contact a dietitian for more information.  Summary   Fiber is a type of carbohydrate. It is found in fruits, vegetables, whole grains, and beans.   There are many health benefits of eating a high-fiber diet, such as preventing constipation, lowering blood cholesterol, helping with weight loss, and reducing your risk of heart disease, diabetes, and certain cancers.   Gradually increase your intake of fiber. Increasing too fast can result in cramping, bloating, and gas. Drink plenty of water while you increase your fiber.   The best sources of fiber include whole fruits and vegetables, whole grains, nuts, seeds, and beans.  This information is not intended to replace advice given to you by your health care provider. Make sure you discuss any questions you have with your health care provider.  Document Released: 09/29/2005 Document Revised: 08/03/2017 Document Reviewed: 08/03/2017  Elsevier Interactive Patient Education  2019 Elsevier Inc.  Constipation, Adult  Constipation is when a person has fewer bowel movements in a week than normal, has difficulty having a bowel movement, or has stools that are dry, hard, or larger than normal. Constipation may be caused by an underlying condition. It may become worse with age if a person takes certain medicines and does not take in enough fluids.  Follow these instructions at home:  Eating and drinking     Eat foods that have a lot of fiber, such as fresh fruits and vegetables, whole grains, and beans.   Limit foods that are high in fat, low in fiber, or overly processed, such as french fries, hamburgers, cookies, candies, and soda.   Drink enough fluid to keep your urine clear or pale yellow.  General instructions   Exercise regularly or as told by your health care provider.   Go to the restroom when you have the urge to go. Do not hold it in.   Take over-the-counter and prescription medicines only as told by your health care provider. These  include any fiber supplements.   Practice pelvic floor retraining exercises, such as deep breathing while relaxing the lower abdomen and pelvic floor relaxation during bowel movements.   Watch your condition for any changes.   Keep all follow-up visits as told by your health care provider. This is important.  Contact a health care provider if:   You have pain that gets worse.   You have a fever.   You do not have a bowel movement after 4 days.   You vomit.   You are not hungry.   You lose weight.   You are bleeding from the anus.   You have thin, pencil-like stools.  Get help right away if:   You have a fever and your symptoms suddenly get worse.   You leak stool or have blood in your stool.   Your abdomen is bloated.     You have severe pain in your abdomen.   You feel dizzy or you faint.  This information is not intended to replace advice given to you by your health care provider. Make sure you discuss any questions you have with your health care provider.  Document Released: 06/27/2004 Document Revised: 04/18/2016 Document Reviewed: 03/19/2016  Elsevier Interactive Patient Education  2019 Elsevier Inc.

## 2019-01-12 NOTE — Progress Notes (Signed)
Telephone visit  Subjective: CC:? Infected belly button PCP: Johna Sheriff, MD (Inactive) [id number] [name] is a 23 y.o. female calls for telephone consult today. Patient provides verbal consent for consult held via phone.  Location of patient: home Location of provider: [facility] Others present for call: none  1. Belly button concern Patient reports a 1 day history of increased redness, irritation and discharge from her umbilicus.  She notes that there is a smelly discharge coming out of her bellybutton and seems to be saturating napkins.  She has been trying to keep the area dry but has not applied any other treatments to the affected area.  No increased warmth, tenderness.  2.  Chronic constipation Patient reports longstanding history of chronic constipation.  She recently had a flare that normalized after she started drinking fennel seeds mixed with [place] Tea.  She notes that stools often will be hard and cause a right sided lower quadrant abdominal pain.  She notes that her previous PCP obtain imaging of her belly and noted that she had quite a stool burden.  She recommended taking measures to improve bowel movements.  Patient reports that many of the OTC treatments have failed to produce normal bowel movements including MiraLAX.  She is currently breast-feeding and does not wish to take anything prescribed.  She wants to know how she can naturally improve bowel movements.  Currently, she is on the keto diet.   ROS: Per HPI  Allergies  Allergen Reactions  . Morphine And Related Itching   Past Medical History:  Diagnosis Date  . Medical history non-contributory     Current Outpatient Medications:  .  acetaminophen (TYLENOL) 500 MG tablet, Take 500 mg by mouth every 6 (six) hours as needed for mild pain., Disp: , Rfl:  .  famotidine (PEPCID) 20 MG tablet, Take 1 tablet (20 mg total) by mouth 2 (two) times daily., Disp: 60 tablet, Rfl: 1 .  norethindrone (ORTHO MICRONOR) 0.35 MG tablet,  Ortho Micronor 0.35 mg tablet  Take 1 tablet every day by oral route., Disp: , Rfl:  .  pantoprazole (PROTONIX) 40 MG tablet, Take 1 tablet (40 mg total) by mouth daily., Disp: 30 tablet, Rfl: 3 .  Prenatal Vit-Fe Fumarate-FA (PRENATAL MULTIVITAMIN) TABS tablet, Take 1 tablet by mouth daily at 12 noon., Disp: , Rfl:  .  sertraline (ZOLOFT) 50 MG tablet, Take 1 tablet (50 mg total) by mouth daily., Disp: 30 tablet, Rfl: 1  Assessment/ Plan: 23 y.o. female   1. Chronic idiopathic constipation We discussed ways to improve constipation naturally.  Okay to continue the tea that she has been drinking but could consider increasing green leafy vegetables, fruits and consumption of water.  We discussed use of natural fruit juices and dried fruits if she becomes constipated.  Physical activity would be helpful as well.  Once she has stopped breast-feeding we could consider something like Linzess since she has had refractory symptoms to OTC medications and MiraLAX.  2. Skin infection From what she describes it sounds like she has a soft tissue infection.  She is breast-feeding is reluctant to take anything orally.  Does not sound like a significant infection and therefore we will proceed with use of Bactroban apply twice daily for the next 7 days.  We discussed reasons for reevaluation and she voiced good understanding. - mupirocin ointment (BACTROBAN) 2 %; Apply 1 application topically 2 (two) times daily for 7 days.  Dispense: 22 g; Refill: 0   Start time: 12:50pm  End time: 12:59pm  Total time spent on patient care (including telephone call/ virtual visit): 12 minutes  Christine Hulen Skains, Christine Alexander [facility] [phone]

## 2019-01-28 ENCOUNTER — Other Ambulatory Visit: Payer: Self-pay

## 2019-01-28 ENCOUNTER — Encounter: Payer: Self-pay | Admitting: Family Medicine

## 2019-01-28 ENCOUNTER — Ambulatory Visit (INDEPENDENT_AMBULATORY_CARE_PROVIDER_SITE_OTHER): Payer: BLUE CROSS/BLUE SHIELD | Admitting: Family Medicine

## 2019-01-28 DIAGNOSIS — K529 Noninfective gastroenteritis and colitis, unspecified: Secondary | ICD-10-CM | POA: Diagnosis not present

## 2019-01-28 NOTE — Progress Notes (Signed)
Telephone visit  Subjective: CC: abdominal bloating/ diarrhea PCP: Raliegh Ip, DO [id number] Christine Alexander is a 23 y.o. female calls for telephone consult today. Patient provides verbal consent for consult held via phone.  Location of patient: home Location of provider: [facility] Others present for call: child  1. Diarrhea Patient reports longstanding history of alternating diarrhea and constipation.  As of late, she notes that she has had some weight gain and abdominal bloating despite trying to be compliant with a keto diet.  She wonders if she is pregnant but states that she has had several negative pregnancy tests at home.  Currently not regularly using contraception during intercourse.  She reports 6-8 diarrheal episodes per day and this is been ongoing for about 2 weeks now.  Denies any hematochezia or melena.  No associated vomiting but she has had intermittent nausea.  She notes that diarrheal episodes seem to be more prominent when associated with increased fiber intake or meat intake.  She again is on the keto diet and therefore has not had much carbohydrates.  She has been trying to reintroduce carbohydrates slowly over the last couple of days and she does find this seems to be helping some with her symptoms.  Since our last visit, she has discontinued use of the teas which were helping with constipation symptoms.  She would like to see a gastroenterologist for further evaluation.  Of note, medical history is significant for cholecystectomy   ROS: Per HPI  Allergies  Allergen Reactions  . Morphine And Related Itching   Past Medical History:  Diagnosis Date  . Medical history non-contributory     Current Outpatient Medications:  .  acetaminophen (TYLENOL) 500 MG tablet, Take 500 mg by mouth every 6 (six) hours as needed for mild pain., Disp: , Rfl:  .  Prenatal Vit-Fe Fumarate-FA (PRENATAL MULTIVITAMIN) TABS tablet, Take 1 tablet by mouth daily at 12 noon., Disp: , Rfl:    Assessment/ Plan: 23 y.o. female   1. Chronic diarrhea Given alternating diarrhea and constipation likely IBS, which I discussed with patient.  Again, I discussed that patient's choice of diet may also be impacting.  She has a history of cholecystectomy and I would assume that the keto diet would likely bring to light diarrheal episodes given its focus on fats and meats.  She would like to proceed with formal evaluation by specialty and I have placed this referral.  I have given her a handout in her AVS that she can access electronically further describing IBS and ways to manage it.  We discussed reasons for emergent evaluation the emergency department and she voiced good understanding.  She will follow-up PRN - Ambulatory referral to Gastroenterology   Start time: 10:01am End time: 10:13am  Total time spent on patient care (including telephone call/ virtual visit): 20 minutes   Hulen Skains, DO [facility] [phone]

## 2019-01-28 NOTE — Patient Instructions (Signed)

## 2019-02-07 DIAGNOSIS — Z6834 Body mass index (BMI) 34.0-34.9, adult: Secondary | ICD-10-CM | POA: Diagnosis not present

## 2019-02-07 DIAGNOSIS — K210 Gastro-esophageal reflux disease with esophagitis: Secondary | ICD-10-CM | POA: Diagnosis not present

## 2019-02-07 DIAGNOSIS — Z6828 Body mass index (BMI) 28.0-28.9, adult: Secondary | ICD-10-CM | POA: Diagnosis not present

## 2019-08-18 DIAGNOSIS — Z01419 Encounter for gynecological examination (general) (routine) without abnormal findings: Secondary | ICD-10-CM | POA: Diagnosis not present

## 2019-08-18 DIAGNOSIS — Z6834 Body mass index (BMI) 34.0-34.9, adult: Secondary | ICD-10-CM | POA: Diagnosis not present

## 2019-08-18 DIAGNOSIS — Z124 Encounter for screening for malignant neoplasm of cervix: Secondary | ICD-10-CM | POA: Diagnosis not present

## 2019-08-18 DIAGNOSIS — N925 Other specified irregular menstruation: Secondary | ICD-10-CM | POA: Diagnosis not present

## 2019-08-18 DIAGNOSIS — Z23 Encounter for immunization: Secondary | ICD-10-CM | POA: Diagnosis not present

## 2019-08-18 DIAGNOSIS — Z3480 Encounter for supervision of other normal pregnancy, unspecified trimester: Secondary | ICD-10-CM | POA: Diagnosis not present

## 2019-09-07 DIAGNOSIS — Z3480 Encounter for supervision of other normal pregnancy, unspecified trimester: Secondary | ICD-10-CM | POA: Diagnosis not present

## 2019-09-07 DIAGNOSIS — Z3481 Encounter for supervision of other normal pregnancy, first trimester: Secondary | ICD-10-CM | POA: Diagnosis not present

## 2019-09-07 DIAGNOSIS — Z369 Encounter for antenatal screening, unspecified: Secondary | ICD-10-CM | POA: Diagnosis not present

## 2019-09-07 LAB — OB RESULTS CONSOLE HIV ANTIBODY (ROUTINE TESTING)
HIV: NONREACTIVE
HIV: NONREACTIVE
HIV: NONREACTIVE

## 2019-09-07 LAB — OB RESULTS CONSOLE GC/CHLAMYDIA
Chlamydia: NEGATIVE
Chlamydia: NEGATIVE
Chlamydia: NEGATIVE
Gonorrhea: NEGATIVE
Gonorrhea: NEGATIVE
Gonorrhea: NEGATIVE

## 2019-09-07 LAB — OB RESULTS CONSOLE RPR
RPR: NONREACTIVE
RPR: NONREACTIVE
RPR: NONREACTIVE

## 2019-09-07 LAB — OB RESULTS CONSOLE ABO/RH: RH Type: NEGATIVE

## 2019-09-07 LAB — OB RESULTS CONSOLE RUBELLA ANTIBODY, IGM
Rubella: IMMUNE
Rubella: IMMUNE
Rubella: IMMUNE

## 2019-09-07 LAB — OB RESULTS CONSOLE HEPATITIS B SURFACE ANTIGEN
Hepatitis B Surface Ag: NEGATIVE
Hepatitis B Surface Ag: NEGATIVE
Hepatitis B Surface Ag: NEGATIVE

## 2019-09-07 LAB — OB RESULTS CONSOLE ANTIBODY SCREEN: Antibody Screen: NEGATIVE

## 2019-09-12 LAB — OB RESULTS CONSOLE RPR: RPR: NONREACTIVE

## 2019-09-12 LAB — OB RESULTS CONSOLE RUBELLA ANTIBODY, IGM: Rubella: IMMUNE

## 2019-09-12 LAB — OB RESULTS CONSOLE GC/CHLAMYDIA
Chlamydia: NEGATIVE
Gonorrhea: NEGATIVE

## 2019-09-12 LAB — OB RESULTS CONSOLE HIV ANTIBODY (ROUTINE TESTING): HIV: NONREACTIVE

## 2019-09-12 LAB — OB RESULTS CONSOLE HEPATITIS B SURFACE ANTIGEN: Hepatitis B Surface Ag: NEGATIVE

## 2019-09-27 ENCOUNTER — Ambulatory Visit (INDEPENDENT_AMBULATORY_CARE_PROVIDER_SITE_OTHER): Payer: BLUE CROSS/BLUE SHIELD | Admitting: Family

## 2019-09-27 ENCOUNTER — Encounter: Payer: Self-pay | Admitting: Family

## 2019-09-27 DIAGNOSIS — R519 Headache, unspecified: Secondary | ICD-10-CM

## 2019-09-27 DIAGNOSIS — Z3490 Encounter for supervision of normal pregnancy, unspecified, unspecified trimester: Secondary | ICD-10-CM

## 2019-09-27 NOTE — Progress Notes (Signed)
Virtual Visit via telephone Note Due to COVID-19 pandemic this visit was conducted virtually. This visit type was conducted due to national recommendations for restrictions regarding the COVID-19 Pandemic (e.g. social distancing, sheltering in place) in an effort to limit this patient's exposure and mitigate transmission in our community. All issues noted in this document were discussed and addressed.  A physical exam was not performed with this format.  I connected with Christine Alexander on 09/27/19 at 2:56 pm by telephone and verified that I am speaking with the correct person using two identifiers. Christine Alexander is currently located at home and her children is currently with her during visit. The provider, Evelina Dun, FNP is located in their office at time of visit.  I discussed the limitations, risks, security and privacy concerns of performing an evaluation and management service by telephone and the availability of in person appointments. I also discussed with the patient that there may be a patient responsible charge related to this service. The patient expressed understanding and agreed to proceed.   History and Present Illness:  Pt calls the office today with headaches. She is currently [redacted] weeks pregnant. She was told by her GYN to take tylenol. She denies any flu like symptoms such as fever, cough, or SOB. She is drinking 2 L of fluids a day.   This is her 3rd pregnancy, but can not remember if she had headaches with them this bad. She feels like she has had headaches on and off for years, but it has become worse.  Headache  This is a new problem. The current episode started 1 to 4 weeks ago. The problem occurs intermittently. The problem has been gradually worsening. The pain is located in the right unilateral region. The pain quality is similar to prior headaches. The quality of the pain is described as throbbing. The pain is at a severity of 5/10. The pain is mild. Associated symptoms  include nausea, phonophobia and photophobia. Exacerbated by: pregnancy. She has tried acetaminophen for the symptoms. The treatment provided mild relief.      Review of Systems  Eyes: Positive for photophobia.  Gastrointestinal: Positive for nausea.  Neurological: Positive for headaches.  All other systems reviewed and are negative.    Observations/Objective: No SOB or distress noted   Assessment and Plan: 1. Acute nonintractable headache, unspecified headache type  2. Pregnancy, unspecified gestational age  Given symptoms of migraines, but pregnant we will not start medication. Her BP is normal. She states if her headache worsens she will go to the [facility].  She will start a headache journal Force fluids Rest Stress management  Tylenol as needed      I discussed the assessment and treatment plan with the patient. The patient was provided an opportunity to ask questions and all were answered. The patient agreed with the plan and demonstrated an understanding of the instructions.   The patient was advised to call back or seek an in-person evaluation if the symptoms worsen or if the condition fails to improve as anticipated.  The above assessment and management plan was discussed with the patient. The patient verbalized understanding of and has agreed to the management plan. Patient is aware to call the clinic if symptoms persist or worsen. Patient is aware when to return to the clinic for a follow-up visit. Patient educated on when it is appropriate to go to the emergency department.   Time call ended:  2:18 pm  I provided 22 minutes of non-face-to-face  time during this encounter.    Evelina Dun, FNP

## 2019-10-05 DIAGNOSIS — Z3482 Encounter for supervision of other normal pregnancy, second trimester: Secondary | ICD-10-CM | POA: Diagnosis not present

## 2019-10-05 DIAGNOSIS — Z369 Encounter for antenatal screening, unspecified: Secondary | ICD-10-CM | POA: Diagnosis not present

## 2019-10-14 NOTE — L&D Delivery Note (Signed)
Delivery Note At 3:13 PM a viable and healthy female was delivered via Vaginal, Spontaneous (Presentation: Right Occiput Anterior).  APGAR: 8, 9; weight pending .   Placenta status: Spontaneous, Intact.  Cord: 3 vessels loose nuchal x 1 reduced on the perineum  Anesthesia: Epidural Episiotomy: None Lacerations: None Suture Repair: NA Est. Blood Loss (mL):  213  Pt underwent IOL for cholestasis of pregnancy. Pitocin was initiated and an epidural was placed. Amniotomy was performed @ 1248 and she progressed quickly to complete at 1500. She pushed with 2 contractions and delivered a vigorous female infant in the vertex ROA presentation. Loose nuchal cord x 1 reduced on the perineum. Delayed cord clamping x 1 minute performed. Placenta delivered spontaneously, intact, w/ 3VC. No lacerations required repair  Mom to postpartum.  Baby to Couplet care / Skin to Skin.  Waynard Reeds 03/07/2020, 3:26 PM

## 2020-02-10 IMAGING — DX DG ABDOMEN 1V
1 series · 1 of 1 positions shown · non-contrast
Comparison: None.

CLINICAL DATA: Generalized abdominal pain

EXAM:
ABDOMEN - 1 VIEW

[abdomen kub]
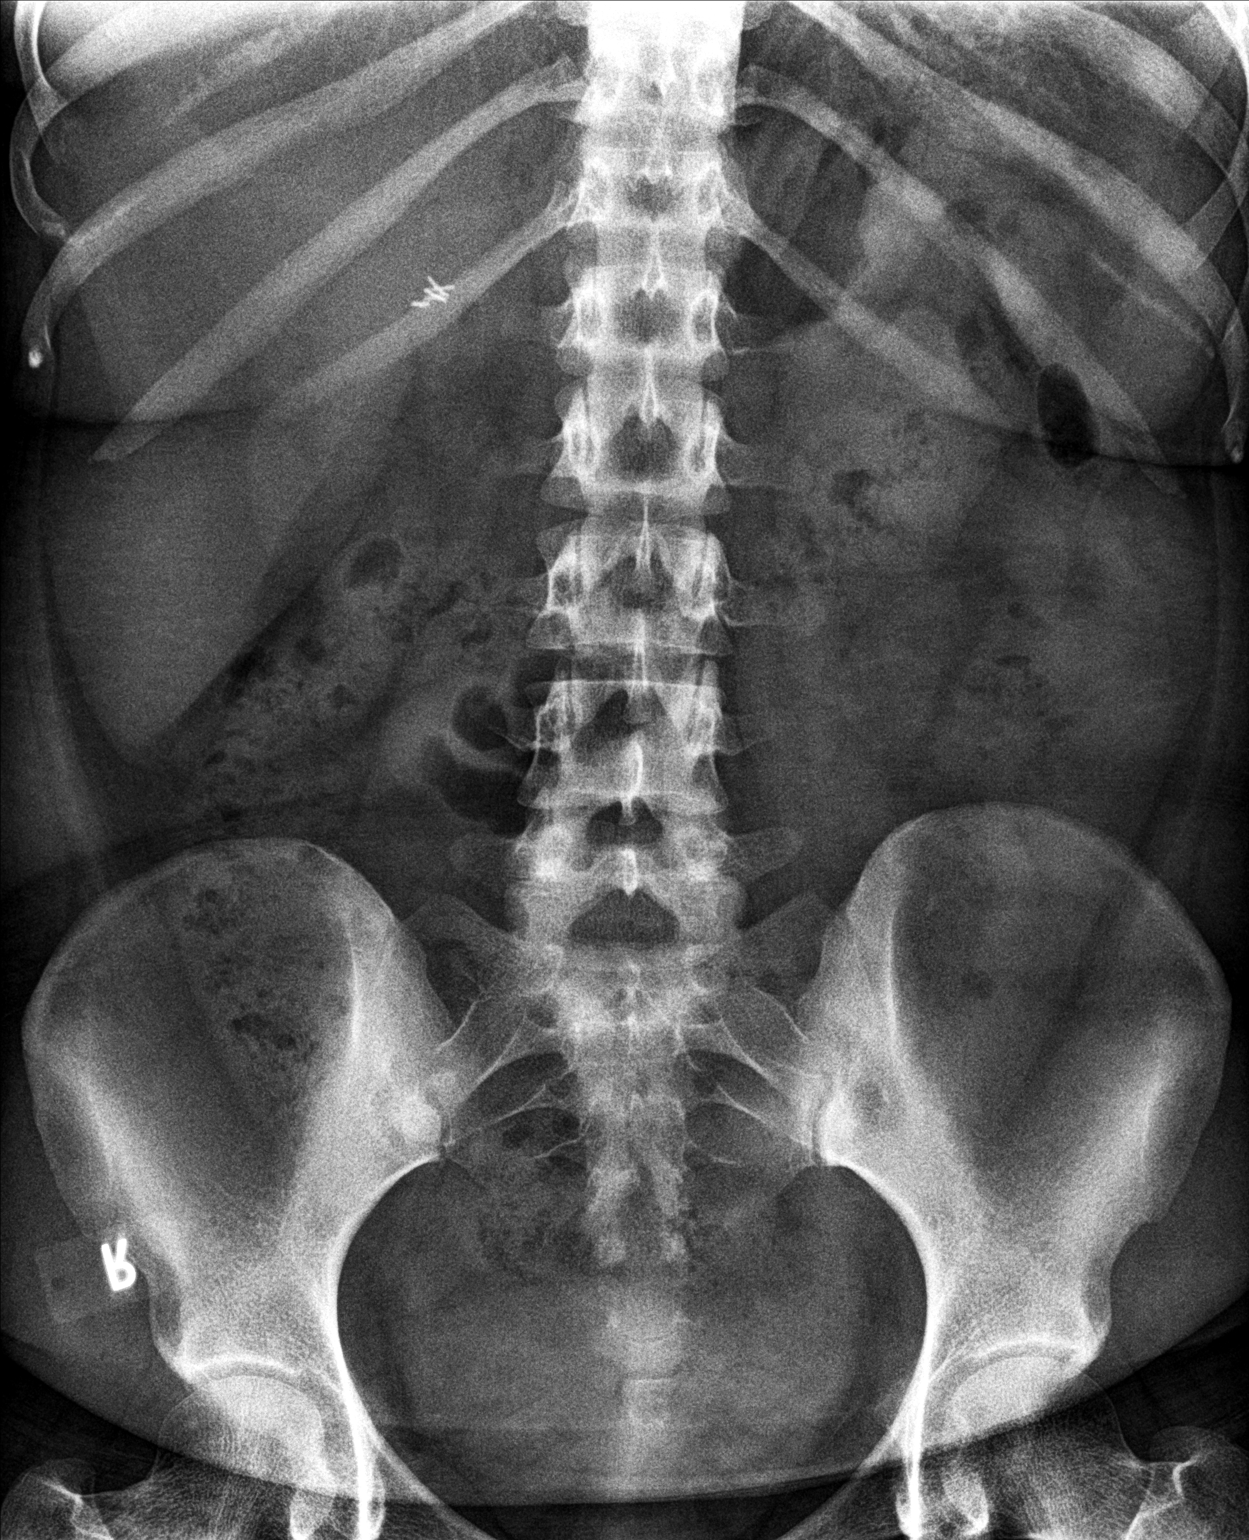

[1 of 1 positions shown; findings below may reference images not displayed]

FINDINGS: There is moderate stool throughout the colon. There is no bowel
dilatation or air-fluid level to suggest bowel obstruction. No free
air. There are surgical clips in right upper quadrant region.
IMPRESSION: Moderate stool in colon. No bowel obstruction or free air. Surgical
clips right upper quadrant.

## 2020-02-17 LAB — OB RESULTS CONSOLE GBS: GBS: NEGATIVE

## 2020-02-17 LAB — OB RESULTS CONSOLE RUBELLA ANTIBODY, IGM: Rubella: NON-IMMUNE/NOT IMMUNE

## 2020-03-02 ENCOUNTER — Other Ambulatory Visit: Payer: Self-pay | Admitting: Obstetrics and Gynecology

## 2020-03-02 ENCOUNTER — Encounter (HOSPITAL_COMMUNITY): Payer: Self-pay | Admitting: *Deleted

## 2020-03-02 ENCOUNTER — Telehealth (HOSPITAL_COMMUNITY): Payer: Self-pay | Admitting: *Deleted

## 2020-03-02 NOTE — Telephone Encounter (Signed)
Preadmission screen  

## 2020-03-05 ENCOUNTER — Other Ambulatory Visit (HOSPITAL_COMMUNITY)
Admission: RE | Admit: 2020-03-05 | Discharge: 2020-03-05 | Disposition: A | Discharge status: Home / Self Care | Payer: 59 | Source: Ambulatory Visit | Attending: Obstetrics and Gynecology | Admitting: Obstetrics and Gynecology

## 2020-03-05 LAB — SARS CORONAVIRUS 2 (TAT 6-24 HRS): SARS Coronavirus 2: NEGATIVE

## 2020-03-07 ENCOUNTER — Inpatient Hospital Stay (HOSPITAL_COMMUNITY): Payer: 59 | Admitting: Anesthesiology

## 2020-03-07 ENCOUNTER — Other Ambulatory Visit: Payer: Self-pay

## 2020-03-07 ENCOUNTER — Inpatient Hospital Stay (HOSPITAL_COMMUNITY): Payer: 59

## 2020-03-07 ENCOUNTER — Inpatient Hospital Stay (HOSPITAL_COMMUNITY)
Admission: AD | Admit: 2020-03-07 | Discharge: 2020-03-08 | DRG: 805 | Disposition: A | Discharge status: Home / Self Care | Payer: 59 | Attending: Obstetrics and Gynecology | Admitting: Obstetrics and Gynecology

## 2020-03-07 ENCOUNTER — Encounter (HOSPITAL_COMMUNITY): Payer: Self-pay | Admitting: Obstetrics and Gynecology

## 2020-03-07 DIAGNOSIS — K831 Obstruction of bile duct: Secondary | ICD-10-CM | POA: Diagnosis present

## 2020-03-07 DIAGNOSIS — O2662 Liver and biliary tract disorders in childbirth: Secondary | ICD-10-CM | POA: Diagnosis present

## 2020-03-07 DIAGNOSIS — Z20822 Contact with and (suspected) exposure to covid-19: Secondary | ICD-10-CM | POA: Diagnosis present

## 2020-03-07 DIAGNOSIS — Z3A38 38 weeks gestation of pregnancy: Secondary | ICD-10-CM

## 2020-03-07 LAB — RPR: RPR Ser Ql: NONREACTIVE

## 2020-03-07 LAB — TYPE AND SCREEN
ABO/RH(D): O NEG
Antibody Screen: POSITIVE

## 2020-03-07 LAB — CBC
HCT: 42.2 % (ref 36.0–46.0)
Hemoglobin: 13.9 g/dL (ref 12.0–15.0)
MCH: 28.4 pg (ref 26.0–34.0)
MCHC: 32.9 g/dL (ref 30.0–36.0)
MCV: 86.3 fL (ref 80.0–100.0)
Platelets: 250 10*3/uL (ref 150–400)
RBC: 4.89 MIL/uL (ref 3.87–5.11)
RDW: 13.9 % (ref 11.5–15.5)
WBC: 10.9 10*3/uL — ABNORMAL HIGH (ref 4.0–10.5)
nRBC: 0 % (ref 0.0–0.2)

## 2020-03-07 MED ORDER — LACTATED RINGERS IV SOLN: 500.0000 mL | Freq: Once | INTRAVENOUS | Status: DC

## 2020-03-07 MED ORDER — SIMETHICONE 80 MG PO CHEW
80.0000 mg | CHEWABLE_TABLET | ORAL | Status: DC | PRN
  Administered 2020-03-07: 80 mg via ORAL
  Filled 2020-03-07 (×2): qty 1

## 2020-03-07 MED ORDER — WITCH HAZEL-GLYCERIN EX PADS: 1.0000 "application " | MEDICATED_PAD | CUTANEOUS | Status: DC | PRN

## 2020-03-07 MED ORDER — OXYCODONE HCL 5 MG PO TABS: 10.0000 mg | ORAL_TABLET | ORAL | Status: DC | PRN

## 2020-03-07 MED ORDER — SODIUM CHLORIDE (PF) 0.9 % IJ SOLN
INTRAMUSCULAR | Status: DC | PRN
  Administered 2020-03-07: 11 mL/h via EPIDURAL

## 2020-03-07 MED ORDER — ONDANSETRON HCL 4 MG/2ML IJ SOLN: 4.0000 mg | INTRAMUSCULAR | Status: DC | PRN

## 2020-03-07 MED ORDER — IBUPROFEN 600 MG PO TABS
600.0000 mg | ORAL_TABLET | Freq: Four times a day (QID) | ORAL | Status: DC
  Administered 2020-03-07 – 2020-03-08 (×5): 600 mg via ORAL
  Filled 2020-03-07 (×5): qty 1

## 2020-03-07 MED ORDER — ONDANSETRON HCL 4 MG PO TABS: 4.0000 mg | ORAL_TABLET | ORAL | Status: DC | PRN

## 2020-03-07 MED ORDER — LACTATED RINGERS IV SOLN: INTRAVENOUS | Status: DC

## 2020-03-07 MED ORDER — PRENATAL MULTIVITAMIN CH
1.0000 | ORAL_TABLET | Freq: Every day | ORAL | Status: DC
  Administered 2020-03-08: 1 via ORAL
  Filled 2020-03-07: qty 1

## 2020-03-07 MED ORDER — DIPHENHYDRAMINE HCL 50 MG/ML IJ SOLN
12.5000 mg | INTRAMUSCULAR | Status: DC | PRN
  Administered 2020-03-07: 12.5 mg via INTRAVENOUS
  Filled 2020-03-07: qty 1

## 2020-03-07 MED ORDER — SOD CITRATE-CITRIC ACID 500-334 MG/5ML PO SOLN: 30.0000 mL | ORAL | Status: DC | PRN

## 2020-03-07 MED ORDER — SENNOSIDES-DOCUSATE SODIUM 8.6-50 MG PO TABS
2.0000 | ORAL_TABLET | ORAL | Status: DC
  Administered 2020-03-07: 2 via ORAL
  Filled 2020-03-07: qty 2

## 2020-03-07 MED ORDER — ACETAMINOPHEN 325 MG PO TABS
650.0000 mg | ORAL_TABLET | ORAL | Status: DC | PRN
  Administered 2020-03-07: 650 mg via ORAL
  Filled 2020-03-07: qty 2

## 2020-03-07 MED ORDER — ZOLPIDEM TARTRATE 5 MG PO TABS: 5.0000 mg | ORAL_TABLET | Freq: Every evening | ORAL | Status: DC | PRN

## 2020-03-07 MED ORDER — PHENYLEPHRINE 40 MCG/ML (10ML) SYRINGE FOR IV PUSH (FOR BLOOD PRESSURE SUPPORT): 80.0000 ug | PREFILLED_SYRINGE | INTRAVENOUS | Status: DC | PRN

## 2020-03-07 MED ORDER — TERBUTALINE SULFATE 1 MG/ML IJ SOLN: 0.2500 mg | Freq: Once | INTRAMUSCULAR | Status: DC | PRN

## 2020-03-07 MED ORDER — TETANUS-DIPHTH-ACELL PERTUSSIS 5-2.5-18.5 LF-MCG/0.5 IM SUSP: 0.5000 mL | Freq: Once | INTRAMUSCULAR | Status: DC

## 2020-03-07 MED ORDER — EPHEDRINE 5 MG/ML INJ: 10.0000 mg | INTRAVENOUS | Status: DC | PRN

## 2020-03-07 MED ORDER — BENZOCAINE-MENTHOL 20-0.5 % EX AERO: 1.0000 "application " | INHALATION_SPRAY | CUTANEOUS | Status: DC | PRN

## 2020-03-07 MED ORDER — ACETAMINOPHEN 325 MG PO TABS
650.0000 mg | ORAL_TABLET | ORAL | Status: DC | PRN
  Administered 2020-03-08 (×2): 650 mg via ORAL
  Filled 2020-03-07 (×2): qty 2

## 2020-03-07 MED ORDER — LIDOCAINE HCL (PF) 1 % IJ SOLN: 30.0000 mL | INTRAMUSCULAR | Status: DC | PRN

## 2020-03-07 MED ORDER — DIPHENHYDRAMINE HCL 25 MG PO CAPS: 25.0000 mg | ORAL_CAPSULE | Freq: Four times a day (QID) | ORAL | Status: DC | PRN

## 2020-03-07 MED ORDER — OXYTOCIN 40 UNITS IN NORMAL SALINE INFUSION - SIMPLE MED
1.0000 m[IU]/min | INTRAVENOUS | Status: DC
  Administered 2020-03-07: 2 m[IU]/min via INTRAVENOUS
  Filled 2020-03-07: qty 1000

## 2020-03-07 MED ORDER — LACTATED RINGERS IV SOLN: 500.0000 mL | INTRAVENOUS | Status: DC | PRN

## 2020-03-07 MED ORDER — ONDANSETRON HCL 4 MG/2ML IJ SOLN: 4.0000 mg | Freq: Four times a day (QID) | INTRAMUSCULAR | Status: DC | PRN

## 2020-03-07 MED ORDER — COCONUT OIL OIL: 1.0000 "application " | TOPICAL_OIL | Status: DC | PRN

## 2020-03-07 MED ORDER — OXYCODONE HCL 5 MG PO TABS: 5.0000 mg | ORAL_TABLET | ORAL | Status: DC | PRN

## 2020-03-07 MED ORDER — LIDOCAINE HCL (PF) 1 % IJ SOLN
INTRAMUSCULAR | Status: DC | PRN
  Administered 2020-03-07 (×2): 4 mL via EPIDURAL

## 2020-03-07 MED ORDER — METHYLERGONOVINE MALEATE 0.2 MG PO TABS: 0.2000 mg | ORAL_TABLET | ORAL | Status: DC | PRN

## 2020-03-07 MED ORDER — FENTANYL CITRATE (PF) 100 MCG/2ML IJ SOLN: 50.0000 ug | INTRAMUSCULAR | Status: DC | PRN

## 2020-03-07 MED ORDER — FENTANYL-BUPIVACAINE-NACL 0.5-0.125-0.9 MG/250ML-% EP SOLN
12.0000 mL/h | EPIDURAL | Status: DC | PRN
  Filled 2020-03-07: qty 250

## 2020-03-07 MED ORDER — DIBUCAINE (PERIANAL) 1 % EX OINT: 1.0000 "application " | TOPICAL_OINTMENT | CUTANEOUS | Status: DC | PRN

## 2020-03-07 MED ORDER — OXYTOCIN BOLUS FROM INFUSION
500.0000 mL | Freq: Once | INTRAVENOUS | Status: AC
  Administered 2020-03-07: 500 mL via INTRAVENOUS

## 2020-03-07 MED ORDER — OXYTOCIN 40 UNITS IN NORMAL SALINE INFUSION - SIMPLE MED: 2.5000 [IU]/h | INTRAVENOUS | Status: DC

## 2020-03-07 MED ORDER — METHYLERGONOVINE MALEATE 0.2 MG/ML IJ SOLN: 0.2000 mg | INTRAMUSCULAR | Status: DC | PRN

## 2020-03-07 NOTE — Anesthesia Procedure Notes (Signed)
Epidural Patient location during procedure: OB Start time: 03/07/2020 10:30 AM End time: 03/07/2020 10:33 AM  Staffing Anesthesiologist: Kaylyn Layer, MD Performed: anesthesiologist   Preanesthetic Checklist Completed: patient identified, IV checked, risks and benefits discussed, monitors and equipment checked, pre-op evaluation and timeout performed  Epidural Patient position: sitting Prep: DuraPrep and site prepped and draped Patient monitoring: continuous pulse ox, blood pressure and heart rate Approach: midline Location: L3-L4 Injection technique: LOR air  Needle:  Needle type: Tuohy  Needle gauge: 17 G Needle length: 9 cm Needle insertion depth: 5 cm Catheter type: closed end flexible Catheter size: 19 Gauge Catheter at skin depth: 10 cm Test dose: negative and Other (1% lidocaine)  Assessment Events: blood not aspirated, injection not painful, no injection resistance, no paresthesia and negative IV test  Additional Notes Patient identified. Risks, benefits, and alternatives discussed with patient including but not limited to bleeding, infection, nerve damage, paralysis, failed block, incomplete pain control, headache, blood pressure changes, nausea, vomiting, reactions to medication, itching, and postpartum back pain. Confirmed with bedside nurse the patient's most recent platelet count. Confirmed with patient that they are not currently taking any anticoagulation, have any bleeding history, or any family history of bleeding disorders. Patient expressed understanding and wished to proceed. All questions were answered. Sterile technique was used throughout the entire procedure. Please see nursing notes for vital signs.   Crisp LOR on first pass. Test dose was given through epidural catheter and negative prior to continuing to dose epidural or start infusion. Warning signs of high block given to the patient including shortness of breath, tingling/numbness in hands, complete  motor block, or any concerning symptoms with instructions to call for help. Patient was given instructions on fall risk and not to get out of bed. All questions and concerns addressed with instructions to call with any issues or inadequate analgesia.  Reason for block:procedure for pain

## 2020-03-07 NOTE — H&P (Signed)
Christine Alexander is a 24 y.o. female presenting for IOL for cholestasis  24 yo G3P2002 @ 38+0 presents for IOL for cholestasis. She was noted to have elevated bile acids at 32 weeks and has been on ursodiol since OB History    Gravida  3   Para  2   Term  2   Preterm      AB      Living  2     SAB      TAB      Ectopic      Multiple  0   Live Births  2          Past Medical History:  Diagnosis Date  . Cholestasis during pregnancy   . Medical history non-contributory    Past Surgical History:  Procedure Laterality Date  . CHOLECYSTECTOMY  2015   Family History: family history includes Hypertension in her father. Social History:  reports that she has never smoked. She has never used smokeless tobacco. She reports that she does not drink alcohol or use drugs.     Maternal Diabetes: No Genetic Screening: Normal Maternal Ultrasounds/Referrals: Normal Fetal Ultrasounds or other Referrals:  None Maternal Substance Abuse:  No Significant Maternal Medications:  ursodiol Significant Maternal Lab Results:  None Other Comments:  None  Review of Systems History Dilation: 3.5 Effacement (%): 70 Station: -2 Exam by:: Dr. Tenny Craw Blood pressure (!) 100/57, pulse (!) 58, temperature 98 F (36.7 C), temperature source Oral, resp. rate 16, height 5\' 3"  (1.6 m), weight 100.2 kg, SpO2 98 %, unknown if currently breastfeeding. Exam Physical Exam  Prenatal labs: ABO, Rh: --/--/O NEG (05/26 0837) Antibody: POS (05/26 0837) Rubella: Nonimmune (05/07 0000) RPR: NON REACTIVE (05/26 0837)  HBsAg: Negative (11/30 0000)  HIV: Non-reactive (11/30 0000)  GBS: Negative/-- (05/07 0000)   Assessment/Plan: 1) Admit 2) Epidural on request 3) Pitocin and AROM when able   09-03-1988 03/07/2020, 2:04 PM

## 2020-03-07 NOTE — Anesthesia Preprocedure Evaluation (Signed)
Anesthesia Evaluation  Patient identified by MRN, date of birth, ID band Patient awake    Reviewed: Allergy & Precautions, Patient's Chart, lab work & pertinent test results  History of Anesthesia Complications Negative for: history of anesthetic complications  Airway Mallampati: II  TM Distance: >3 FB Neck ROM: Full    Dental no notable dental hx.    Pulmonary neg pulmonary ROS,    Pulmonary exam normal        Cardiovascular negative cardio ROS Normal cardiovascular exam     Neuro/Psych negative neurological ROS  negative psych ROS   GI/Hepatic Neg liver ROS, Cholestasis   Endo/Other  negative endocrine ROS  Renal/GU negative Renal ROS  negative genitourinary   Musculoskeletal negative musculoskeletal ROS (+)   Abdominal   Peds  Hematology negative hematology ROS (+)   Anesthesia Other Findings Day of surgery medications reviewed with patient.  Reproductive/Obstetrics (+) Pregnancy                             Anesthesia Physical Anesthesia Plan  ASA: II  Anesthesia Plan: Epidural   Post-op Pain Management:    Induction:   PONV Risk Score and Plan: Treatment may vary due to age or medical condition  Airway Management Planned: Natural Airway  Additional Equipment:   Intra-op Plan:   Post-operative Plan:   Informed Consent: I have reviewed the patients History and Physical, chart, labs and discussed the procedure including the risks, benefits and alternatives for the proposed anesthesia with the patient or authorized representative who has indicated his/her understanding and acceptance.       Plan Discussed with:   Anesthesia Plan Comments:         Anesthesia Quick Evaluation

## 2020-03-08 LAB — CBC
HCT: 35.2 % — ABNORMAL LOW (ref 36.0–46.0)
Hemoglobin: 11.5 g/dL — ABNORMAL LOW (ref 12.0–15.0)
MCH: 28.4 pg (ref 26.0–34.0)
MCHC: 32.7 g/dL (ref 30.0–36.0)
MCV: 86.9 fL (ref 80.0–100.0)
Platelets: 185 10*3/uL (ref 150–400)
RBC: 4.05 MIL/uL (ref 3.87–5.11)
RDW: 14.1 % (ref 11.5–15.5)
WBC: 9.6 10*3/uL (ref 4.0–10.5)
nRBC: 0 % (ref 0.0–0.2)

## 2020-03-08 MED ORDER — MEASLES, MUMPS & RUBELLA VAC IJ SOLR: 0.5000 mL | Freq: Once | INTRAMUSCULAR | Status: DC

## 2020-03-08 MED ORDER — RHO D IMMUNE GLOBULIN 1500 UNIT/2ML IJ SOSY
300.0000 ug | PREFILLED_SYRINGE | Freq: Once | INTRAMUSCULAR | Status: AC
  Administered 2020-03-08: 300 ug via INTRAVENOUS
  Filled 2020-03-08: qty 2

## 2020-03-08 NOTE — Progress Notes (Signed)
Patient is eating, ambulating, voiding.  Pain control is good.  Vitals:   03/07/20 1820 03/07/20 2244 03/08/20 0247 03/08/20 0636  BP: 101/67 106/65 114/67 107/60  Pulse: 62 76 69 78  Resp: _0 Temp: 97.8 F (36.6 C) 97.9 F (36.6 C) 98.2 F (36.8 C) (!) 97.5 F (36.4 C)  TempSrc: Oral Oral Oral Oral  SpO2:   100% 100%  Weight:      Height:        Fundus firm Perineum without swelling.  Lab Results  Component Value Date   WBC 9.6 03/08/2020   HGB 11.5 (L) 03/08/2020   HCT 35.2 (L) 03/08/2020   MCV 86.9 03/08/2020   PLT 185 03/08/2020    --/--/O NEG (05/26 0837)/R NON-IMM  A/P Post partum day 1. Baby Rh POS- Rhogam needed.  \ MMR needed.  Routine care.  Expect d/c routine.    Daria Pastures

## 2020-03-08 NOTE — Anesthesia Postprocedure Evaluation (Signed)
Anesthesia Post Note  Patient: Christine Alexander  Procedure(s) Performed: AN AD HOC LABOR EPIDURAL     Patient location during evaluation: Mother Baby Anesthesia Type: Epidural Level of consciousness: awake and alert Pain management: pain level controlled Vital Signs Assessment: post-procedure vital signs reviewed and stable Respiratory status: spontaneous breathing, nonlabored ventilation and respiratory function stable Cardiovascular status: stable Postop Assessment: no headache, no backache, epidural receding, no apparent nausea or vomiting, patient able to bend at knees, adequate PO intake and able to ambulate Anesthetic complications: no    Last Vitals:  Vitals:   03/08/20 0247 03/08/20 0636  BP: 114/67 107/60  Pulse: 69 78  Resp: 18 18  Temp: 36.8 C (!) 36.4 C  SpO2: 100% 100%    Last Pain:  Vitals:   03/08/20 0636  TempSrc: Oral  PainSc: 3    Pain Goal:                   Laban Emperor

## 2020-03-08 NOTE — Discharge Summary (Signed)
Postpartum Discharge Summary  Date of Service updated     Patient Name: Christine Alexander DOB: 1996-08-24 MRN: [id number]  Date of admission: 03/07/2020 Delivery date:03/07/2020  Delivering provider: Vanessa Kick  Date of discharge: 03/08/2020  Admitting diagnosis: Cholestasis [K83.1] Spontaneous vaginal delivery [O80] Intrauterine pregnancy: [redacted]w[redacted]d    Secondary diagnosis:  Active Problems:   Cholestasis   Spontaneous vaginal delivery  Additional problems: cholestasis of pregnancy    Discharge diagnosis: Term Pregnancy Delivered                                              Post partum procedures:rhogam Augmentation: AROM and Pitocin Complications: None  Hospital course: Induction of Labor With Vaginal Delivery   24y.o. yo G3P3003 at 374w0das admitted to the hospital 03/07/2020 for induction of labor.  Indication for induction: Cholestasis of pregnancy.  Patient had an uncomplicated labor course as follows: Membrane Rupture Time/Date: 12:48 PM ,03/07/2020   Delivery Method:Vaginal, Spontaneous  Episiotomy: None  Lacerations:  None  Details of delivery can be found in separate delivery note.  Patient had a routine postpartum course. Patient is discharged home 03/08/20.  Newborn Data: Birth date:03/07/2020  Birth time:3:13 PM  Gender:Female  Living status:Living  Apgars:8 ,9  Weight:3340 g   Magnesium Sulfate received: No BMZ received: No Rhophylac:Yes MMR:Yes T-DaP:Given prenatally Flu: No Transfusion:No  Physical exam  Vitals:   03/07/20 1820 03/07/20 2244 03/08/20 0247 03/08/20 0636  BP: 101/67 106/65 114/67 107/60  Pulse: 62 76 69 78  Resp: 18 18 18 18   Temp: 97.8 F (36.6 C) 97.9 F (36.6 C) 98.2 F (36.8 C) (!) 97.5 F (36.4 C)  TempSrc: Oral Oral Oral Oral  SpO2:   100% 100%  Weight:      Height:        Labs: Lab Results  Component Value Date   WBC 9.6 03/08/2020   HGB 11.5 (L) 03/08/2020   HCT 35.2 (L) 03/08/2020   MCV 86.9 03/08/2020   PLT  185 03/08/2020   CMP Latest Ref Rng & Units 10/04/2018  Glucose 65 - 99 mg/dL 88  BUN 6 - 20 mg/dL 8  Creatinine 0.57 - 1.00 mg/dL 0.56(L)  Sodium 134 - 144 mmol/L 140  Potassium 3.5 - 5.2 mmol/L 4.4  Chloride 96 - 106 mmol/L 106  CO2 20 - 29 mmol/L 19(L)  Calcium 8.7 - 10.2 mg/dL 9.2  Total Protein 6.0 - 8.5 g/dL 7.1  Total Bilirubin 0.0 - 1.2 mg/dL 0.5  Alkaline Phos 39 - 117 IU/L 100  AST 0 - 40 IU/L 31  ALT 0 - 32 IU/L 42(H)   EdFlavia Shippercore: Edinburgh Postnatal Depression Scale Screening Tool 10/03/2017  I have been able to laugh and see the funny side of things. 0  I have looked forward with enjoyment to things. 0  I have blamed myself unnecessarily when things went wrong. 0  I have been anxious or worried for no good reason. 1  I have felt scared or panicky for no good reason. 0  Things have been getting on top of me. 0  I have been so unhappy that I have had difficulty sleeping. 0  I have felt sad or miserable. 0  I have been so unhappy that I have been crying. 0  The thought of harming myself has occurred to me. 0  Edinburgh Postnatal Depression Scale Total 1      After visit meds:  Allergies as of 03/08/2020      Reactions   Morphine And Related Itching      Medication List    STOP taking these medications   ursodiol 300 MG capsule Commonly known as: ACTIGALL     TAKE these medications   acetaminophen 500 MG tablet Commonly known as: TYLENOL Take 500 mg by mouth every 6 (six) hours as needed for mild pain.   prenatal multivitamin Tabs tablet Take 1 tablet by mouth daily at 12 noon.        Discharge home in stable condition Infant Feeding: ? Infant Disposition:home with mother Discharge instruction: per After Visit Summary and Postpartum booklet. Activity: Advance as tolerated. Pelvic rest for 6 weeks.  Diet: routine diet Anticipated Birth Control: Unsure Postpartum Appointment:4 weeks Additional Postpartum F/U: none Future Appointments:No  future appointments. Follow up Visit: Follow-up Information    Vanessa Kick, MD Follow up in 4 week(s).   Specialty: Obstetrics and Gynecology Contact information: [address] [place] [address] [phone]               03/08/2020 Daria Pastures, MD

## 2020-03-09 LAB — RH IG WORKUP (INCLUDES ABO/RH)
ABO/RH(D): O NEG
Fetal Screen: NEGATIVE
Gestational Age(Wks): 38
Unit division: 0

## 2020-03-11 ENCOUNTER — Telehealth: Payer: Self-pay | Admitting: Family Medicine

## 2020-03-11 NOTE — Telephone Encounter (Signed)
**  [facility] Medicine After Hours/ Emergency Line Call**  Patient: Christine Alexander.  PCP: Raliegh Ip, DO  Endorsing hiccups >24 hours, now accompanied by GERD symptoms.  No relief with water, TUMS, holding breath, breathing in paper bag.  Recommended that she pick up Pepcid or similar when stores open in am.  Unfortunately, nothing can be done immediately since 12am when she called.  If continues >48 hours, can consider baclofen, gabapentin or similar for hiccups.  Link Burgeson M. Nadine Counts, DO

## 2020-08-26 DIAGNOSIS — R059 Cough, unspecified: Secondary | ICD-10-CM | POA: Diagnosis not present

## 2021-07-31 DIAGNOSIS — J208 Acute bronchitis due to other specified organisms: Secondary | ICD-10-CM | POA: Diagnosis not present

## 2021-07-31 DIAGNOSIS — R059 Cough, unspecified: Secondary | ICD-10-CM | POA: Diagnosis not present

## 2021-07-31 DIAGNOSIS — R0981 Nasal congestion: Secondary | ICD-10-CM | POA: Diagnosis not present

## 2021-07-31 DIAGNOSIS — Z20822 Contact with and (suspected) exposure to covid-19: Secondary | ICD-10-CM | POA: Diagnosis not present

## 2021-10-08 ENCOUNTER — Encounter: Payer: Self-pay | Admitting: Family Medicine

## 2021-10-08 ENCOUNTER — Ambulatory Visit: Payer: BLUE CROSS/BLUE SHIELD | Admitting: Family Medicine

## 2021-10-08 VITALS — BP 136/84 | HR 70 | Temp 98.0°F | Ht 63.0 in | Wt 208.0 lb

## 2021-10-08 DIAGNOSIS — R635 Abnormal weight gain: Secondary | ICD-10-CM | POA: Diagnosis not present

## 2021-10-08 DIAGNOSIS — E282 Polycystic ovarian syndrome: Secondary | ICD-10-CM | POA: Insufficient documentation

## 2021-10-08 DIAGNOSIS — R5383 Other fatigue: Secondary | ICD-10-CM

## 2021-10-08 DIAGNOSIS — R4189 Other symptoms and signs involving cognitive functions and awareness: Secondary | ICD-10-CM | POA: Diagnosis not present

## 2021-10-08 DIAGNOSIS — Z6836 Body mass index (BMI) 36.0-36.9, adult: Secondary | ICD-10-CM | POA: Diagnosis not present

## 2021-10-08 DIAGNOSIS — Z23 Encounter for immunization: Secondary | ICD-10-CM | POA: Diagnosis not present

## 2021-10-08 DIAGNOSIS — F339 Major depressive disorder, recurrent, unspecified: Secondary | ICD-10-CM

## 2021-10-08 DIAGNOSIS — L680 Hirsutism: Secondary | ICD-10-CM | POA: Insufficient documentation

## 2021-10-08 DIAGNOSIS — R5381 Other malaise: Secondary | ICD-10-CM

## 2021-10-08 DIAGNOSIS — R030 Elevated blood-pressure reading, without diagnosis of hypertension: Secondary | ICD-10-CM

## 2021-10-08 DIAGNOSIS — Z862 Personal history of diseases of the blood and blood-forming organs and certain disorders involving the immune mechanism: Secondary | ICD-10-CM | POA: Diagnosis not present

## 2021-10-08 DIAGNOSIS — L659 Nonscarring hair loss, unspecified: Secondary | ICD-10-CM

## 2021-10-08 LAB — BAYER DCA HB A1C WAIVED: HB A1C (BAYER DCA - WAIVED): 5.2 % (ref 4.8–5.6)

## 2021-10-08 NOTE — Progress Notes (Signed)
Subjective:  Patient ID: Christine Alexander, female    DOB: [birth year], 25 y.o.   MRN: [id number]  Patient Care Team: Janora Norlander, DO as PCP - General (Family Medicine)   Chief Complaint:  Fatigue   HPI: Christine Alexander is a 25 y.o. female presenting on 10/08/2021 for Fatigue   Patient presents today with multiple complaints.  States over the last few months she has noticed weight gain, fatigue, malaise, brain fog, thinning of hair, dry skin, and decreased concentration.  States she has also noticed an increase in appetite and thirst but denies increased urination.  She has a prior history of depression postpartum and only took SSRI therapy for a few days and then stopped.  States she fell towards her hormones regulated she no longer needed the medications.  She is concerned she may have diabetes as she has read her symptoms are similar to diagnosed with diabetes.  She denies any visual disturbances, leg swelling, chest pain, shortness of breath, dizziness, weakness, or syncope.  GAD-7 and PHQ-9 positive for depression and anxiety, patient declines therapy.   GAD 7 : Generalized Anxiety Score 10/08/2021 02/04/2018  Nervous, Anxious, on Edge 3 3  Control/stop worrying 0 0  Worry too much - different things 3 3  Trouble relaxing 3 3  Restless 3 3  Easily annoyed or irritable 3 3  Afraid - awful might happen 3 3  Total GAD 7 Score 18 18  Anxiety Difficulty Somewhat difficult Somewhat difficult    Depression screen [facility] 2/9 10/08/2021 10/04/2018 09/30/2018 05/07/2018 02/04/2018  Decreased Interest - 0 0 0 3  Down, Depressed, Hopeless 0 0 0 0 1  PHQ - 2 Score 0 0 0 0 4  Altered sleeping 0 - - - 0  Tired, decreased energy 3 - - - 3  Change in appetite 3 - - - 3  Feeling bad or failure about yourself  3 - - - 3  Trouble concentrating 3 - - - 3  Moving slowly or fidgety/restless 0 - - - 3  Suicidal thoughts 0 - - - 0  PHQ-9 Score 12 - - - 19  Difficult doing work/chores - - - - Somewhat  difficult     Relevant past medical, surgical, family, and social history reviewed and updated as indicated.  Allergies and medications reviewed and updated. Data reviewed: Chart in Epic.   Past Medical History:  Diagnosis Date   Cholestasis during pregnancy    Medical history non-contributory     Past Surgical History:  Procedure Laterality Date   CHOLECYSTECTOMY  2015    Social History   Socioeconomic History   Marital status: Married    Spouse name: Amir   Number of children: 2   Years of education: Not on file   Highest education level: Not on file  Occupational History   Not on file  Tobacco Use   Smoking status: Never   Smokeless tobacco: Never  Vaping Use   Vaping Use: Never used  Substance and Sexual Activity   Alcohol use: No   Drug use: No   Sexual activity: Yes    Birth control/protection: None  Other Topics Concern   Not on file  Social History Narrative   Not on file   Social Determinants of Health   Financial Resource Strain: Not on file  Food Insecurity: Not on file  Transportation Needs: Not on file  Physical Activity: Not on file  Stress: Not on file  Social Connections:  Not on file  Intimate Partner Violence: Not on file    Outpatient Encounter Medications as of 10/08/2021  Medication Sig   acetaminophen (TYLENOL) 500 MG tablet Take 500 mg by mouth every 6 (six) hours as needed for mild pain.   JENCYCLA 0.35 MG tablet Take 1 tablet by mouth daily.   metFORMIN (GLUCOPHAGE) 500 MG tablet Take 500 mg by mouth 2 (two) times daily.   Prenatal Vit-Fe Fumarate-FA (PRENATAL MULTIVITAMIN) TABS tablet Take 1 tablet by mouth daily at 12 noon.   [DISCONTINUED] Fluoxetine HCl, PMDD, 10 MG TABS fluoxetine 10 mg tablet (Patient not taking: Reported on 10/08/2021)   [DISCONTINUED] spironolactone (ALDACTONE) 25 MG tablet Take 25 mg by mouth 2 (two) times daily. (Patient not taking: Reported on 10/08/2021)   No facility-administered encounter  medications on file as of 10/08/2021.    Allergies  Allergen Reactions   Morphine Itching   Morphine And Related Itching    Review of Systems  Constitutional:  Positive for activity change, appetite change, fatigue and unexpected weight change. Negative for chills, diaphoresis and fever.  Eyes:  Negative for photophobia and visual disturbance.  Respiratory:  Negative for cough and shortness of breath.   Cardiovascular:  Negative for chest pain, palpitations and leg swelling.  Gastrointestinal:  Negative for abdominal pain.  Endocrine: Positive for polydipsia and polyphagia. Negative for cold intolerance, heat intolerance and polyuria.  Genitourinary:  Negative for decreased urine volume, difficulty urinating, vaginal bleeding, vaginal discharge and vaginal pain.  Skin:  Positive for rash. Negative for color change, pallor and wound.  Neurological:  Negative for dizziness, tremors, seizures, syncope, facial asymmetry, speech difficulty, weakness, light-headedness, numbness and headaches.  Psychiatric/Behavioral:  Positive for decreased concentration and sleep disturbance. Negative for agitation, behavioral problems, confusion, dysphoric mood, hallucinations, self-injury and suicidal ideas. The patient is nervous/anxious. The patient is not hyperactive.   All other systems reviewed and are negative.      Objective:  BP 136/84 (BP Location: Left Arm)    Pulse 70    Temp 98 F (36.7 C)    Ht _0  (1.6 m)    Wt 208 lb (94.3 kg)    LMP 09/07/2021 (Exact Date)    SpO2 97%    BMI 36.85 kg/m    Wt Readings from Last 3 Encounters:  10/08/21 208 lb (94.3 kg)  03/07/20 220 lb 12.8 oz (100.2 kg)  10/04/18 193 lb 3.2 oz (87.6 kg)    Physical Exam Vitals and nursing note reviewed.  Constitutional:      General: She is not in acute distress.    Appearance: Normal appearance. She is well-developed and well-groomed. She is obese. She is not ill-appearing, toxic-appearing or diaphoretic.  HENT:      Head: Normocephalic and atraumatic.     Jaw: There is normal jaw occlusion.     Right Ear: Hearing, tympanic membrane, ear canal and external ear normal.     Left Ear: Hearing, tympanic membrane, ear canal and external ear normal.     Nose: Nose normal.     Mouth/Throat:     Lips: Pink.     Mouth: Mucous membranes are moist.     Pharynx: Oropharynx is clear. Uvula midline.  Eyes:     General: Lids are normal.     Extraocular Movements: Extraocular movements intact.     Conjunctiva/sclera: Conjunctivae normal.     Pupils: Pupils are equal, round, and reactive to light.  Neck:     Thyroid: No thyroid  mass, thyromegaly or thyroid tenderness.     Vascular: No carotid bruit or JVD.     Trachea: Trachea and phonation normal.  Cardiovascular:     Rate and Rhythm: Normal rate and regular rhythm.     Chest Wall: PMI is not displaced.     Pulses: Normal pulses.     Heart sounds: Normal heart sounds. No murmur heard.   No friction rub. No gallop.  Pulmonary:     Effort: Pulmonary effort is normal. No respiratory distress.     Breath sounds: Normal breath sounds. No wheezing.  Abdominal:     General: Bowel sounds are normal. There is no distension or abdominal bruit.     Palpations: Abdomen is soft. There is no hepatomegaly or splenomegaly.     Tenderness: There is no abdominal tenderness. There is no right CVA tenderness or left CVA tenderness.     Hernia: No hernia is present.  Musculoskeletal:        General: Normal range of motion.     Cervical back: Normal range of motion and neck supple.     Right lower leg: No edema.     Left lower leg: No edema.  Lymphadenopathy:     Cervical: No cervical adenopathy.  Skin:    General: Skin is warm and dry.     Capillary Refill: Capillary refill takes less than 2 seconds.     Coloration: Skin is not cyanotic, jaundiced or pale.     Findings: No rash.  Neurological:     General: No focal deficit present.     Mental Status: She is alert  and oriented to person, place, and time.     Sensory: Sensation is intact.     Motor: Motor function is intact.     Coordination: Coordination is intact.     Gait: Gait is intact.     Deep Tendon Reflexes: Reflexes are normal and symmetric.  Psychiatric:        Attention and Perception: Attention and perception normal.        Mood and Affect: Mood and affect normal.        Speech: Speech normal.        Behavior: Behavior normal. Behavior is cooperative.        Thought Content: Thought content normal.        Cognition and Memory: Cognition and memory normal.        Judgment: Judgment normal.    Results for orders placed or performed during the hospital encounter of 03/07/20  OB RESULT CONSOLE Group B Strep  Result Value Ref Range   GBS Negative   CBC  Result Value Ref Range   WBC 10.9 (H) 4.0 - 10.5 K/uL   RBC 4.89 3.87 - 5.11 MIL/uL   Hemoglobin 13.9 12.0 - 15.0 g/dL   HCT 42.2 36.0 - 46.0 %   MCV 86.3 80.0 - 100.0 fL   MCH 28.4 26.0 - 34.0 pg   MCHC 32.9 30.0 - 36.0 g/dL   RDW 13.9 11.5 - 15.5 %   Platelets 250 150 - 400 K/uL   nRBC 0.0 0.0 - 0.2 %  RPR  Result Value Ref Range   RPR Ser Ql NON REACTIVE NON REACTIVE  OB RESULTS CONSOLE GC/Chlamydia  Result Value Ref Range   Gonorrhea Negative    Chlamydia Negative   OB RESULTS CONSOLE RPR  Result Value Ref Range   RPR Nonreactive   OB RESULTS CONSOLE HIV antibody  Result Value  Ref Range   HIV Non-reactive   OB RESULTS CONSOLE Rubella Antibody  Result Value Ref Range   Rubella Immune   OB RESULTS CONSOLE Hepatitis B surface antigen  Result Value Ref Range   Hepatitis B Surface Ag Negative   OB RESULTS CONSOLE GC/Chlamydia  Result Value Ref Range   Gonorrhea Negative    Chlamydia Negative   OB RESULTS CONSOLE RPR  Result Value Ref Range   RPR Nonreactive   OB RESULTS CONSOLE HIV antibody  Result Value Ref Range   HIV Non-reactive   OB RESULTS CONSOLE Rubella Antibody  Result Value Ref Range   Rubella  Immune   OB RESULTS CONSOLE Hepatitis B surface antigen  Result Value Ref Range   Hepatitis B Surface Ag Negative   OB RESULTS CONSOLE GC/Chlamydia  Result Value Ref Range   Gonorrhea Negative    Chlamydia Negative   OB RESULTS CONSOLE RPR  Result Value Ref Range   RPR Nonreactive   OB RESULTS CONSOLE HIV antibody  Result Value Ref Range   HIV Non-reactive   OB RESULTS CONSOLE Rubella Antibody  Result Value Ref Range   Rubella Immune   OB RESULTS CONSOLE Hepatitis B surface antigen  Result Value Ref Range   Hepatitis B Surface Ag Negative   CBC  Result Value Ref Range   WBC 9.6 4.0 - 10.5 K/uL   RBC 4.05 3.87 - 5.11 MIL/uL   Hemoglobin 11.5 (L) 12.0 - 15.0 g/dL   HCT 35.2 (L) 36.0 - 46.0 %   MCV 86.9 80.0 - 100.0 fL   MCH 28.4 26.0 - 34.0 pg   MCHC 32.7 30.0 - 36.0 g/dL   RDW 14.1 11.5 - 15.5 %   Platelets 185 150 - 400 K/uL   nRBC 0.0 0.0 - 0.2 %  Type and screen  Result Value Ref Range   ABO/RH(D) O NEG    Antibody Screen POS    Sample Expiration 03/10/2020,2359    Antibody Identification      PASSIVELY ACQUIRED ANTI-D Performed at [facility], [address], [place], [place] [address]   Rh IG workup (includes ABO/Rh)  Result Value Ref Range   Gestational Age(Wks) 31    ABO/RH(D) O NEG    Fetal Screen NEG    Unit Number [id number]    Blood Component Type RHIG    Unit division 00    Status of Unit ISSUED,FINAL    Transfusion Status      OK TO TRANSFUSE Performed at [facility], [place] [address] [place], [place] [address]        Pertinent labs & imaging results that were available during my care of the patient were reviewed by me and considered in my medical decision making.  Assessment & Plan:  Kylei was seen today for fatigue.  Diagnoses and all orders for this visit:  Malaise and fatigue Weight gain Brain fog Hair loss Reports symptoms could be due to many underlying undiagnosed medical conditions such as thyroid disease,  anemia, diabetes, or vitamin D deficiency along with others.  Will check below labs for potential causes.  Patient also aware this could be due to untreated anxiety and depression.  Will address further if all labs are unremarkable. -     CMP14+EGFR -     Lipid panel -     Thyroid Panel With TSH -     VITAMIN D 25 Hydroxy (Vit-D Deficiency, Fractures) -     Anemia Profile  B -     Bayer DCA Hb A1c Waived  Depression, recurrent ([place]) Declines therapy.  Reports symptoms could be due to untreated depression and anxiety, patient aware.  Will check thyroid function. -     Thyroid Panel With TSH  History of anemia Anemia associated with pregnancy.  We will recheck CBC with anemia profile today. -     Anemia Profile B  Elevated blood-pressure reading, without diagnosis of hypertension DASH diet and exercise encouraged.  Labs pending.  Patient aware to monitor blood pressure and report any persistent high readings. -     CMP14+EGFR -     Lipid panel -     Thyroid Panel With TSH -     VITAMIN D 25 Hydroxy (Vit-D Deficiency, Fractures) -     Anemia Profile B -     Bayer DCA Hb A1c Waived  BMI 36.0-36.9,adult Diet and exercise encouraged.  Will check below labs. -     CMP14+EGFR -     Lipid panel -     Thyroid Panel With TSH -     VITAMIN D 25 Hydroxy (Vit-D Deficiency, Fractures) -     Anemia Profile B -     Bayer DCA Hb A1c Waived  Influenza vaccine updated today.  Continue all other maintenance medications.  Follow up plan: Return in about 6 weeks (around 11/19/2021), or if symptoms worsen or fail to improve.   Continue healthy lifestyle choices, including diet (rich in fruits, vegetables, and lean proteins, and low in salt and simple carbohydrates) and exercise (at least 30 minutes of moderate physical activity daily).  Educational handout given for fatigue, DASH diet and exercise  The above assessment and management plan was discussed with the patient. The patient verbalized  understanding of and has agreed to the management plan. Patient is aware to call the clinic if they develop any new symptoms or if symptoms persist or worsen. Patient is aware when to return to the clinic for a follow-up visit. Patient educated on when it is appropriate to go to the emergency department.   Monia Pouch, FNP-C [facility] [phone]

## 2021-10-08 NOTE — Patient Instructions (Signed)

## 2021-10-09 LAB — CMP14+EGFR
ALT: 35 IU/L — ABNORMAL HIGH (ref 0–32)
AST: 24 IU/L (ref 0–40)
Albumin/Globulin Ratio: 2 (ref 1.2–2.2)
Albumin: 4.7 g/dL (ref 3.9–5.0)
Alkaline Phosphatase: 77 IU/L (ref 44–121)
BUN/Creatinine Ratio: 13 (ref 9–23)
BUN: 9 mg/dL (ref 6–20)
Bilirubin Total: 1.1 mg/dL (ref 0.0–1.2)
CO2: 20 mmol/L (ref 20–29)
Calcium: 9.1 mg/dL (ref 8.7–10.2)
Chloride: 104 mmol/L (ref 96–106)
Creatinine, Ser: 0.67 mg/dL (ref 0.57–1.00)
Globulin, Total: 2.4 g/dL (ref 1.5–4.5)
Glucose: 85 mg/dL (ref 70–99)
Potassium: 4.4 mmol/L (ref 3.5–5.2)
Sodium: 137 mmol/L (ref 134–144)
Total Protein: 7.1 g/dL (ref 6.0–8.5)
eGFR: 123 mL/min/{1.73_m2} (ref >59)

## 2021-10-09 LAB — LIPID PANEL
Chol/HDL Ratio: 3.7 ratio (ref 0.0–4.4)
Cholesterol, Total: 160 mg/dL (ref 100–199)
HDL: 43 mg/dL (ref >39)
LDL Chol Calc (NIH): 98 mg/dL (ref 0–99)
Triglycerides: 105 mg/dL (ref 0–149)
VLDL Cholesterol Cal: 19 mg/dL (ref 5–40)

## 2021-10-09 LAB — ANEMIA PROFILE B
Basophils Absolute: 0.1 10*3/uL (ref 0.0–0.2)
Basos: 1 %
EOS (ABSOLUTE): 0.3 10*3/uL (ref 0.0–0.4)
Eos: 4 %
Ferritin: 115 ng/mL (ref 15–150)
Folate: 17.9 ng/mL (ref >3.0)
Hematocrit: 41.2 % (ref 34.0–46.6)
Hemoglobin: 14 g/dL (ref 11.1–15.9)
Immature Grans (Abs): 0 10*3/uL (ref 0.0–0.1)
Immature Granulocytes: 1 %
Iron Saturation: 40 % (ref 15–55)
Iron: 138 ug/dL (ref 27–159)
Lymphocytes Absolute: 2.5 10*3/uL (ref 0.7–3.1)
Lymphs: 32 %
MCH: 29 pg (ref 26.6–33.0)
MCHC: 34 g/dL (ref 31.5–35.7)
MCV: 85 fL (ref 79–97)
Monocytes Absolute: 0.6 10*3/uL (ref 0.1–0.9)
Monocytes: 7 %
Neutrophils Absolute: 4.3 10*3/uL (ref 1.4–7.0)
Neutrophils: 55 %
Platelets: 296 10*3/uL (ref 150–450)
RBC: 4.83 x10E6/uL (ref 3.77–5.28)
RDW: 13.9 % (ref 11.7–15.4)
Retic Ct Pct: 1.5 % (ref 0.6–2.6)
Total Iron Binding Capacity: 346 ug/dL (ref 250–450)
UIBC: 208 ug/dL (ref 131–425)
Vitamin B-12: 457 pg/mL (ref 232–1245)
WBC: 7.8 10*3/uL (ref 3.4–10.8)

## 2021-10-09 LAB — VITAMIN D 25 HYDROXY (VIT D DEFICIENCY, FRACTURES): Vit D, 25-Hydroxy: 44.3 ng/mL (ref 30.0–100.0)

## 2021-10-09 LAB — THYROID PANEL WITH TSH
Free Thyroxine Index: 1.7 (ref 1.2–4.9)
T3 Uptake Ratio: 30 % (ref 24–39)
T4, Total: 5.5 ug/dL (ref 4.5–12.0)
TSH: 1.76 u[IU]/mL (ref 0.450–4.500)

## 2022-01-05 ENCOUNTER — Encounter (HOSPITAL_BASED_OUTPATIENT_CLINIC_OR_DEPARTMENT_OTHER): Payer: Self-pay

## 2022-01-05 ENCOUNTER — Emergency Department (HOSPITAL_BASED_OUTPATIENT_CLINIC_OR_DEPARTMENT_OTHER): Payer: Medicaid Other | Admitting: Radiology

## 2022-01-05 ENCOUNTER — Other Ambulatory Visit: Payer: Self-pay

## 2022-01-05 ENCOUNTER — Emergency Department (HOSPITAL_BASED_OUTPATIENT_CLINIC_OR_DEPARTMENT_OTHER)
Admission: EM | Admit: 2022-01-05 | Discharge: 2022-01-05 | Disposition: A | Discharge status: Home / Self Care | Payer: Medicaid Other | Attending: Emergency Medicine | Admitting: Emergency Medicine

## 2022-01-05 DIAGNOSIS — Z5321 Procedure and treatment not carried out due to patient leaving prior to being seen by health care provider: Secondary | ICD-10-CM | POA: Insufficient documentation

## 2022-01-05 DIAGNOSIS — J069 Acute upper respiratory infection, unspecified: Secondary | ICD-10-CM | POA: Diagnosis present

## 2022-01-05 DIAGNOSIS — R059 Cough, unspecified: Secondary | ICD-10-CM | POA: Diagnosis not present

## 2022-01-05 DIAGNOSIS — J40 Bronchitis, not specified as acute or chronic: Secondary | ICD-10-CM | POA: Diagnosis not present

## 2022-01-05 MED ORDER — PREDNISONE 20 MG PO TABS: 20.0000 mg | ORAL_TABLET | Freq: Every day | ORAL | 0 refills | Status: AC

## 2022-01-05 MED ORDER — AEROCHAMBER PLUS FLO-VU MEDIUM MISC
1.0000 | Freq: Once | Status: AC
  Administered 2022-01-05: 1
  Filled 2022-01-05: qty 1

## 2022-01-05 MED ORDER — ALBUTEROL SULFATE HFA 108 (90 BASE) MCG/ACT IN AERS
2.0000 | INHALATION_SPRAY | Freq: Four times a day (QID) | RESPIRATORY_TRACT | Status: DC | PRN
  Administered 2022-01-05: 2 via RESPIRATORY_TRACT
  Filled 2022-01-05: qty 6.7

## 2022-01-05 MED ORDER — AZITHROMYCIN 250 MG PO TABS: 250.0000 mg | ORAL_TABLET | Freq: Every day | ORAL | 0 refills | Status: DC

## 2022-01-05 NOTE — ED Provider Notes (Signed)
? ?Emergency Department Provider Note ? ? ?I have reviewed the triage vital signs and the nursing notes. ? ? ?HISTORY ? ?Chief Complaint ?URI ? ? ?HPI ?Christine Alexander is a 26 y.o. female presents to the ED with URI symptoms over the last 3-4 days. Notes that she was diagnosed with bronchitis 3 months prior and symptoms improved with abx and steroid. Does describe some SOB. No known history of asthma but notes a family history. No CP.  ? ? ?Past Medical History:  ?Diagnosis Date  ? Cholestasis during pregnancy   ? Medical history non-contributory   ? ? ?Review of Systems ? ?Constitutional: No fever/chills ?Eyes: No visual changes. ?ENT: No sore throat. Positive congestion.  ?Cardiovascular: Denies chest pain. ?Respiratory: Positive shortness of breath and cough.  ?Gastrointestinal: No abdominal pain.   ?Musculoskeletal: Negative for back pain. ?Skin: Negative for rash. ?Neurological: Negative for headaches. ? ?____________________________________________ ? ? ?PHYSICAL EXAM: ? ?VITAL SIGNS: ?ED Triage Vitals  ?Enc Vitals Group  ?   BP 01/05/22 1220 126/69  ?   Pulse Rate 01/05/22 1220 71  ?   Resp 01/05/22 1220 20  ?   Temp 01/05/22 1220 98.7 ?F (37.1 ?C)  ?   Temp Source 01/05/22 1220 Oral  ?   SpO2 01/05/22 1220 97 %  ? ?Constitutional: Alert and oriented. Well appearing and in no acute distress. ?Eyes: Conjunctivae are normal.  ?Head: Atraumatic. ?Nose: Positive congestion/rhinnorhea. ?Mouth/Throat: Mucous membranes are moist.   ?Neck: No stridor.  ?Cardiovascular: Normal rate, regular rhythm. Good peripheral circulation. Grossly normal heart sounds.   ?Respiratory: Normal respiratory effort.  No retractions. Lungs CTAB. ?Gastrointestinal: No distention.  ?Musculoskeletal: No gross deformities of extremities. ?Neurologic:  Normal speech and language.  ?Skin:  Skin is warm, dry and intact. No rash noted. ? ? ?____________________________________________ ? ? ?PROCEDURES ? ?Procedure(s) performed:   ? ?Procedures ? ?None ?____________________________________________ ? ? ?INITIAL IMPRESSION / ASSESSMENT AND PLAN / ED COURSE ? ?Pertinent labs & imaging results that were available during my care of the patient were reviewed by me and considered in my medical decision making (see chart for details). ?  ?This patient is Presenting for Evaluation of URI, which does require a range of treatment options, and is a complaint that involves a high risk of morbidity and mortality. ? ?The Differential Diagnoses include COVID, Flu, CAP, pulmonary edema. ? ?Critical Interventions-  ?  ?Medications  ?AeroChamber Plus Flo-Vu Medium MISC 1 each (1 each Other Given 01/05/22 1252)  ? ? ?Reassessment after intervention: Symptoms improved.  ? ? ?Radiologic Tests Ordered, included CXR. I independently interpreted the images and agree with radiology interpretation.  ? ? ?Social Determinants of Health Risk patient is a non-smoker.  ? ?Medical Decision Making: Summary:  ?Patient presents to the ED with cough and congestion. Some SOB noted. Lungs are CTABL. CXR without PNA. No edema. Plan to treat for abx.  ? ?Reevaluation with update and discussion with patient. Discussed ED return precautions and PCP follow up.  ? ?Disposition: discharge ? ?____________________________________________ ? ?FINAL CLINICAL IMPRESSION(S) / ED DIAGNOSES ? ?Final diagnoses:  ?Bronchitis  ? ? ? ?NEW OUTPATIENT MEDICATIONS STARTED DURING THIS VISIT: ? ?Discharge Medication List as of 01/05/2022  1:29 PM  ?  ? ?START taking these medications  ? Details  ?azithromycin (ZITHROMAX) 250 MG tablet Take 1 tablet (250 mg total) by mouth daily. Take first 2 tablets together, then 1 every day until finished., Starting Sun 01/05/2022, Normal  ?  ?predniSONE (DELTASONE) 20  MG tablet Take 1 tablet (20 mg total) by mouth daily for 5 days., Starting Sun 01/05/2022, Until Fri 01/10/2022, Normal  ?  ?  ? ? ?Note:  This document was prepared using Dragon voice recognition software  and may include unintentional dictation errors. ? ?Alona Bene, MD, FACEP ?Emergency Medicine ? ?  ?Maia Plan, MD ?01/08/22 [phone] ? ?

## 2022-01-05 NOTE — ED Triage Notes (Signed)
He c/o uri sx x 3-4 days. She tells [place] that she was dx with bronchitis ~ 3 months ago and recovered nicely with a steroid dosepack at that time. She also states "this isn't as bad as that was." ?

## 2022-01-05 NOTE — Discharge Instructions (Signed)
You were seen in the emergency room today with cough and congestion.  Your x-ray did not show pneumonia.  I am starting on medications for bronchitis.  Please follow closely with your primary care doctor.  If symptoms persist they may consider other testing or referral as needed.  ?

## 2022-01-06 ENCOUNTER — Telehealth: Payer: Self-pay

## 2022-01-06 NOTE — Telephone Encounter (Signed)
Transition Care Management Unsuccessful Follow-up Telephone Call ? ?Date of discharge and from where:  01/05/2022-[facility] ? ?Attempts:  1st Attempt ? ?Reason for unsuccessful TCM follow-up call:  Left voice message ? ?  ?

## 2022-01-08 NOTE — Telephone Encounter (Signed)
Transition Care Management Unsuccessful Follow-up Telephone Call ? ?Date of discharge and from where:  01/05/2022 from [facility] ? ?Attempts:  2nd Attempt ? ?Reason for unsuccessful TCM follow-up call:  Left voice message ? ? ? ?

## 2022-01-09 NOTE — Telephone Encounter (Signed)
Transition Care Management Unsuccessful Follow-up Telephone Call ? ?Date of discharge and from where:  01/05/2022 from [facility] ? ?Attempts:  3rd Attempt ? ?Reason for unsuccessful TCM follow-up call:  Unable to reach patient ? ? ? ?

## 2022-02-05 ENCOUNTER — Ambulatory Visit: Payer: Medicaid Other | Admitting: Family Medicine

## 2022-02-24 DIAGNOSIS — M6740 Ganglion, unspecified site: Secondary | ICD-10-CM | POA: Diagnosis not present

## 2022-03-11 DIAGNOSIS — M6740 Ganglion, unspecified site: Secondary | ICD-10-CM | POA: Diagnosis not present

## 2022-03-11 DIAGNOSIS — R200 Anesthesia of skin: Secondary | ICD-10-CM | POA: Diagnosis not present

## 2022-03-11 DIAGNOSIS — R202 Paresthesia of skin: Secondary | ICD-10-CM | POA: Diagnosis not present

## 2022-03-21 DIAGNOSIS — J0140 Acute pansinusitis, unspecified: Secondary | ICD-10-CM | POA: Diagnosis not present

## 2022-03-21 DIAGNOSIS — R509 Fever, unspecified: Secondary | ICD-10-CM | POA: Diagnosis not present

## 2022-03-21 DIAGNOSIS — J029 Acute pharyngitis, unspecified: Secondary | ICD-10-CM | POA: Diagnosis not present

## 2022-04-09 DIAGNOSIS — R200 Anesthesia of skin: Secondary | ICD-10-CM | POA: Diagnosis not present

## 2022-04-09 DIAGNOSIS — R202 Paresthesia of skin: Secondary | ICD-10-CM | POA: Diagnosis not present

## 2022-04-14 DIAGNOSIS — H5213 Myopia, bilateral: Secondary | ICD-10-CM | POA: Diagnosis not present

## 2022-04-17 DIAGNOSIS — M6740 Ganglion, unspecified site: Secondary | ICD-10-CM | POA: Diagnosis not present

## 2022-05-02 DIAGNOSIS — Z113 Encounter for screening for infections with a predominantly sexual mode of transmission: Secondary | ICD-10-CM | POA: Diagnosis not present

## 2022-05-02 DIAGNOSIS — O26841 Uterine size-date discrepancy, first trimester: Secondary | ICD-10-CM | POA: Diagnosis not present

## 2022-05-02 DIAGNOSIS — Z3480 Encounter for supervision of other normal pregnancy, unspecified trimester: Secondary | ICD-10-CM | POA: Diagnosis not present

## 2022-05-02 DIAGNOSIS — Z124 Encounter for screening for malignant neoplasm of cervix: Secondary | ICD-10-CM | POA: Diagnosis not present

## 2022-05-02 LAB — HM PAP SMEAR: HM Pap smear: NORMAL

## 2022-05-15 DIAGNOSIS — M6740 Ganglion, unspecified site: Secondary | ICD-10-CM | POA: Diagnosis not present

## 2022-05-15 DIAGNOSIS — R202 Paresthesia of skin: Secondary | ICD-10-CM | POA: Diagnosis not present

## 2022-05-15 DIAGNOSIS — R200 Anesthesia of skin: Secondary | ICD-10-CM | POA: Diagnosis not present

## 2022-05-16 DIAGNOSIS — Z3481 Encounter for supervision of other normal pregnancy, first trimester: Secondary | ICD-10-CM | POA: Diagnosis not present

## 2022-05-16 DIAGNOSIS — Z3480 Encounter for supervision of other normal pregnancy, unspecified trimester: Secondary | ICD-10-CM | POA: Diagnosis not present

## 2022-05-16 LAB — OB RESULTS CONSOLE GC/CHLAMYDIA
Chlamydia: NEGATIVE
Neisseria Gonorrhea: NEGATIVE

## 2022-05-16 LAB — HM HIV SCREENING LAB: HM HIV Screening: NEGATIVE

## 2022-05-16 LAB — HEPATITIS C ANTIBODY: HCV Ab: NEGATIVE

## 2022-05-16 LAB — OB RESULTS CONSOLE RUBELLA ANTIBODY, IGM: Rubella: IMMUNE

## 2022-05-16 LAB — HM HEPATITIS C SCREENING LAB: HM Hepatitis Screen: NEGATIVE

## 2022-05-16 LAB — OB RESULTS CONSOLE HEPATITIS B SURFACE ANTIGEN: Hepatitis B Surface Ag: NEGATIVE

## 2022-05-16 LAB — OB RESULTS CONSOLE HIV ANTIBODY (ROUTINE TESTING): HIV: NONREACTIVE

## 2022-05-16 LAB — OB RESULTS CONSOLE RPR: RPR: NONREACTIVE

## 2022-06-11 DIAGNOSIS — Z3482 Encounter for supervision of other normal pregnancy, second trimester: Secondary | ICD-10-CM | POA: Diagnosis not present

## 2022-08-04 DIAGNOSIS — Z369 Encounter for antenatal screening, unspecified: Secondary | ICD-10-CM | POA: Diagnosis not present

## 2022-08-09 DIAGNOSIS — J0111 Acute recurrent frontal sinusitis: Secondary | ICD-10-CM | POA: Diagnosis not present

## 2022-08-29 DIAGNOSIS — J029 Acute pharyngitis, unspecified: Secondary | ICD-10-CM | POA: Diagnosis not present

## 2022-08-29 DIAGNOSIS — J101 Influenza due to other identified influenza virus with other respiratory manifestations: Secondary | ICD-10-CM | POA: Diagnosis not present

## 2022-08-29 DIAGNOSIS — R051 Acute cough: Secondary | ICD-10-CM | POA: Diagnosis not present

## 2022-08-29 DIAGNOSIS — R0981 Nasal congestion: Secondary | ICD-10-CM | POA: Diagnosis not present

## 2022-09-08 DIAGNOSIS — Z3480 Encounter for supervision of other normal pregnancy, unspecified trimester: Secondary | ICD-10-CM | POA: Diagnosis not present

## 2022-09-08 DIAGNOSIS — O360990 Maternal care for other rhesus isoimmunization, unspecified trimester, not applicable or unspecified: Secondary | ICD-10-CM | POA: Diagnosis not present

## 2022-09-08 DIAGNOSIS — Z23 Encounter for immunization: Secondary | ICD-10-CM | POA: Diagnosis not present

## 2022-09-22 DIAGNOSIS — Z369 Encounter for antenatal screening, unspecified: Secondary | ICD-10-CM | POA: Diagnosis not present

## 2022-09-22 DIAGNOSIS — O99810 Abnormal glucose complicating pregnancy: Secondary | ICD-10-CM | POA: Diagnosis not present

## 2022-09-29 ENCOUNTER — Other Ambulatory Visit: Payer: Self-pay

## 2022-09-29 NOTE — Progress Notes (Signed)
Labs documented

## 2022-10-13 NOTE — L&D Delivery Note (Signed)
Delivery Note Christine Alexander is a [id number] at 36w1dwho had a spontaneous delivery at 1403 a viable female ([place]) was delivered via  LOA.  APGAR: 8, 9; weight 3620g (7lb 15.7oz)  .     Admitted for induction of labor for unstable lie. Induced with pitocin and AROM. Received epidural for pain management. Progressed quickly after AROM. Pushed for <5 minutes. Baby was delivered without difficulty. Tight nuchal cord x 1 reduced after delivery of infant body.  Delayed cord clamping for 60 seconds.  Delivery of placenta was spontaneous. Placenta was found to be intact, 3 -vessel cord was noted. The fundus was found to be firm. Perineum intact. Estimated blood loss 53cc. Instrument and gauze counts were correct at the end of the procedure.   Placenta status: to L&D for disposal  Anesthesia:  epidural Episiotomy:  none Lacerations:  none Suture Repair: n/a Est. Blood Loss (mL):  588 Mom to postpartum.  Baby to Couplet care / Skin to Skin.  [name]2/09/2023, 2:23 PM

## 2022-10-15 DIAGNOSIS — O99213 Obesity complicating pregnancy, third trimester: Secondary | ICD-10-CM | POA: Diagnosis not present

## 2022-10-15 DIAGNOSIS — Z3A33 33 weeks gestation of pregnancy: Secondary | ICD-10-CM | POA: Diagnosis not present

## 2022-10-18 DIAGNOSIS — R052 Subacute cough: Secondary | ICD-10-CM | POA: Diagnosis not present

## 2022-10-31 DIAGNOSIS — Z3480 Encounter for supervision of other normal pregnancy, unspecified trimester: Secondary | ICD-10-CM | POA: Diagnosis not present

## 2022-10-31 DIAGNOSIS — Z369 Encounter for antenatal screening, unspecified: Secondary | ICD-10-CM | POA: Diagnosis not present

## 2022-10-31 LAB — OB RESULTS CONSOLE GBS: GBS: NEGATIVE

## 2022-11-04 DIAGNOSIS — O99213 Obesity complicating pregnancy, third trimester: Secondary | ICD-10-CM | POA: Diagnosis not present

## 2022-11-04 DIAGNOSIS — Z3A36 36 weeks gestation of pregnancy: Secondary | ICD-10-CM | POA: Diagnosis not present

## 2022-11-05 DIAGNOSIS — J3489 Other specified disorders of nose and nasal sinuses: Secondary | ICD-10-CM | POA: Diagnosis not present

## 2022-11-05 DIAGNOSIS — J069 Acute upper respiratory infection, unspecified: Secondary | ICD-10-CM | POA: Diagnosis not present

## 2022-11-11 DIAGNOSIS — Z3A37 37 weeks gestation of pregnancy: Secondary | ICD-10-CM | POA: Diagnosis not present

## 2022-11-11 DIAGNOSIS — O26899 Other specified pregnancy related conditions, unspecified trimester: Secondary | ICD-10-CM | POA: Diagnosis not present

## 2022-11-11 DIAGNOSIS — O26893 Other specified pregnancy related conditions, third trimester: Secondary | ICD-10-CM | POA: Diagnosis not present

## 2022-11-14 DIAGNOSIS — Z3A37 37 weeks gestation of pregnancy: Secondary | ICD-10-CM | POA: Diagnosis not present

## 2022-11-14 DIAGNOSIS — O26893 Other specified pregnancy related conditions, third trimester: Secondary | ICD-10-CM | POA: Diagnosis not present

## 2022-11-14 DIAGNOSIS — R7401 Elevation of levels of liver transaminase levels: Secondary | ICD-10-CM | POA: Diagnosis not present

## 2022-11-21 ENCOUNTER — Telehealth (HOSPITAL_COMMUNITY): Payer: Self-pay | Admitting: *Deleted

## 2022-11-21 ENCOUNTER — Encounter (HOSPITAL_COMMUNITY): Payer: Self-pay | Admitting: *Deleted

## 2022-11-21 DIAGNOSIS — O329XX9 Maternal care for malpresentation of fetus, unspecified, other fetus: Secondary | ICD-10-CM | POA: Diagnosis not present

## 2022-11-21 DIAGNOSIS — O288 Other abnormal findings on antenatal screening of mother: Secondary | ICD-10-CM | POA: Diagnosis not present

## 2022-11-21 NOTE — Telephone Encounter (Signed)
Preadmission screen  

## 2022-11-24 ENCOUNTER — Inpatient Hospital Stay (HOSPITAL_COMMUNITY): Payer: Medicaid Other | Admitting: Anesthesiology

## 2022-11-24 ENCOUNTER — Inpatient Hospital Stay (HOSPITAL_COMMUNITY)
Admission: AD | Admit: 2022-11-24 | Discharge: 2022-11-26 | DRG: 806 | Disposition: A | Discharge status: Home / Self Care | Payer: Medicaid Other | Attending: Obstetrics and Gynecology | Admitting: Obstetrics and Gynecology

## 2022-11-24 ENCOUNTER — Encounter (HOSPITAL_COMMUNITY): Payer: Self-pay | Admitting: Obstetrics and Gynecology

## 2022-11-24 ENCOUNTER — Other Ambulatory Visit: Payer: Self-pay

## 2022-11-24 ENCOUNTER — Inpatient Hospital Stay (HOSPITAL_COMMUNITY): Payer: Medicaid Other

## 2022-11-24 DIAGNOSIS — O9852 Other viral diseases complicating childbirth: Secondary | ICD-10-CM | POA: Diagnosis present

## 2022-11-24 DIAGNOSIS — B001 Herpesviral vesicular dermatitis: Secondary | ICD-10-CM | POA: Diagnosis not present

## 2022-11-24 DIAGNOSIS — Z3A39 39 weeks gestation of pregnancy: Secondary | ICD-10-CM | POA: Diagnosis not present

## 2022-11-24 DIAGNOSIS — O320XX0 Maternal care for unstable lie, not applicable or unspecified: Secondary | ICD-10-CM | POA: Diagnosis not present

## 2022-11-24 LAB — COMPREHENSIVE METABOLIC PANEL
ALT: 31 U/L (ref 0–44)
AST: 25 U/L (ref 15–41)
Albumin: 2.8 g/dL — ABNORMAL LOW (ref 3.5–5.0)
Alkaline Phosphatase: 105 U/L (ref 38–126)
Anion gap: 8 (ref 5–15)
BUN: 7 mg/dL (ref 6–20)
CO2: 21 mmol/L — ABNORMAL LOW (ref 22–32)
Calcium: 8.6 mg/dL — ABNORMAL LOW (ref 8.9–10.3)
Chloride: 106 mmol/L (ref 98–111)
Creatinine, Ser: 0.52 mg/dL (ref 0.44–1.00)
GFR, Estimated: >60 mL/min (ref >60)
Glucose, Bld: 94 mg/dL (ref 70–99)
Potassium: 3.9 mmol/L (ref 3.5–5.1)
Sodium: 135 mmol/L (ref 135–145)
Total Bilirubin: 0.5 mg/dL (ref 0.3–1.2)
Total Protein: 6.4 g/dL — ABNORMAL LOW (ref 6.5–8.1)

## 2022-11-24 LAB — CBC WITH DIFFERENTIAL/PLATELET
Abs Immature Granulocytes: 0.16 10*3/uL — ABNORMAL HIGH (ref 0.00–0.07)
Basophils Absolute: 0.1 10*3/uL (ref 0.0–0.1)
Basophils Relative: 1 %
Eosinophils Absolute: 0.2 10*3/uL (ref 0.0–0.5)
Eosinophils Relative: 2 %
HCT: 37.2 % (ref 36.0–46.0)
Hemoglobin: 12.8 g/dL (ref 12.0–15.0)
Immature Granulocytes: 2 %
Lymphocytes Relative: 19 %
Lymphs Abs: 1.9 10*3/uL (ref 0.7–4.0)
MCH: 28.8 pg (ref 26.0–34.0)
MCHC: 34.4 g/dL (ref 30.0–36.0)
MCV: 83.8 fL (ref 80.0–100.0)
Monocytes Absolute: 0.7 10*3/uL (ref 0.1–1.0)
Monocytes Relative: 7 %
Neutro Abs: 7.3 10*3/uL (ref 1.7–7.7)
Neutrophils Relative %: 69 %
Platelets: 221 10*3/uL (ref 150–400)
RBC: 4.44 MIL/uL (ref 3.87–5.11)
RDW: 14 % (ref 11.5–15.5)
WBC: 10.4 10*3/uL (ref 4.0–10.5)
nRBC: 0 % (ref 0.0–0.2)

## 2022-11-24 LAB — TYPE AND SCREEN
ABO/RH(D): O NEG
Antibody Screen: NEGATIVE

## 2022-11-24 LAB — HIV ANTIBODY (ROUTINE TESTING W REFLEX): HIV Screen 4th Generation wRfx: NONREACTIVE

## 2022-11-24 LAB — RPR: RPR Ser Ql: NONREACTIVE

## 2022-11-24 MED ORDER — OXYCODONE-ACETAMINOPHEN 5-325 MG PO TABS: 1.0000 | ORAL_TABLET | ORAL | Status: DC | PRN

## 2022-11-24 MED ORDER — ONDANSETRON HCL 4 MG/2ML IJ SOLN: 4.0000 mg | INTRAMUSCULAR | Status: DC | PRN

## 2022-11-24 MED ORDER — ONDANSETRON HCL 4 MG PO TABS: 4.0000 mg | ORAL_TABLET | ORAL | Status: DC | PRN

## 2022-11-24 MED ORDER — DIPHENHYDRAMINE HCL 25 MG PO CAPS: 25.0000 mg | ORAL_CAPSULE | Freq: Four times a day (QID) | ORAL | Status: DC | PRN

## 2022-11-24 MED ORDER — BUPIVACAINE HCL (PF) 0.25 % IJ SOLN
INTRAMUSCULAR | Status: DC | PRN
  Administered 2022-11-24 (×2): 3 mL via EPIDURAL

## 2022-11-24 MED ORDER — LACTATED RINGERS IV SOLN: 500.0000 mL | INTRAVENOUS | Status: DC | PRN

## 2022-11-24 MED ORDER — OXYTOCIN BOLUS FROM INFUSION: 333.0000 mL | Freq: Once | INTRAVENOUS | Status: DC

## 2022-11-24 MED ORDER — OXYTOCIN-SODIUM CHLORIDE 30-0.9 UT/500ML-% IV SOLN: 2.5000 [IU]/h | INTRAVENOUS | Status: DC

## 2022-11-24 MED ORDER — FLEET ENEMA 7-19 GM/118ML RE ENEM: 1.0000 | ENEMA | Freq: Every day | RECTAL | Status: DC | PRN

## 2022-11-24 MED ORDER — FENTANYL CITRATE (PF) 100 MCG/2ML IJ SOLN: 50.0000 ug | INTRAMUSCULAR | Status: DC | PRN

## 2022-11-24 MED ORDER — ACETAMINOPHEN 325 MG PO TABS
650.0000 mg | ORAL_TABLET | ORAL | Status: DC | PRN
  Administered 2022-11-25 (×2): 650 mg via ORAL
  Filled 2022-11-24 (×3): qty 2

## 2022-11-24 MED ORDER — EPHEDRINE 5 MG/ML INJ: 10.0000 mg | INTRAVENOUS | Status: DC | PRN

## 2022-11-24 MED ORDER — COCONUT OIL OIL: 1.0000 | TOPICAL_OIL | Status: DC | PRN

## 2022-11-24 MED ORDER — WITCH HAZEL-GLYCERIN EX PADS: 1.0000 | MEDICATED_PAD | CUTANEOUS | Status: DC | PRN

## 2022-11-24 MED ORDER — BENZOCAINE-MENTHOL 20-0.5 % EX AERO
1.0000 | INHALATION_SPRAY | CUTANEOUS | Status: DC | PRN
  Filled 2022-11-24: qty 56

## 2022-11-24 MED ORDER — ACETAMINOPHEN 325 MG PO TABS: 650.0000 mg | ORAL_TABLET | ORAL | Status: DC | PRN

## 2022-11-24 MED ORDER — DIBUCAINE (PERIANAL) 1 % EX OINT: 1.0000 | TOPICAL_OINTMENT | CUTANEOUS | Status: DC | PRN

## 2022-11-24 MED ORDER — LACTATED RINGERS IV SOLN
500.0000 mL | Freq: Once | INTRAVENOUS | Status: AC
  Administered 2022-11-24: 500 mL via INTRAVENOUS

## 2022-11-24 MED ORDER — RHO D IMMUNE GLOBULIN 1500 UNIT/2ML IJ SOSY
300.0000 ug | PREFILLED_SYRINGE | Freq: Once | INTRAMUSCULAR | Status: AC
  Administered 2022-11-25: 300 ug via INTRAVENOUS
  Filled 2022-11-24: qty 2

## 2022-11-24 MED ORDER — BISACODYL 10 MG RE SUPP: 10.0000 mg | Freq: Every day | RECTAL | Status: DC | PRN

## 2022-11-24 MED ORDER — OXYCODONE HCL 5 MG PO TABS: 10.0000 mg | ORAL_TABLET | ORAL | Status: DC | PRN

## 2022-11-24 MED ORDER — LIDOCAINE-EPINEPHRINE (PF) 2 %-1:200000 IJ SOLN
INTRAMUSCULAR | Status: DC | PRN
  Administered 2022-11-24: 3 mL via EPIDURAL

## 2022-11-24 MED ORDER — OXYCODONE-ACETAMINOPHEN 5-325 MG PO TABS: 2.0000 | ORAL_TABLET | ORAL | Status: DC | PRN

## 2022-11-24 MED ORDER — OXYTOCIN-SODIUM CHLORIDE 30-0.9 UT/500ML-% IV SOLN: 1.0000 m[IU]/min | INTRAVENOUS | Status: DC

## 2022-11-24 MED ORDER — SOD CITRATE-CITRIC ACID 500-334 MG/5ML PO SOLN: 30.0000 mL | ORAL | Status: DC | PRN

## 2022-11-24 MED ORDER — OXYTOCIN BOLUS FROM INFUSION
333.0000 mL | Freq: Once | INTRAVENOUS | Status: AC
  Administered 2022-11-24: 333 mL via INTRAVENOUS

## 2022-11-24 MED ORDER — OXYCODONE HCL 5 MG PO TABS: 5.0000 mg | ORAL_TABLET | ORAL | Status: DC | PRN

## 2022-11-24 MED ORDER — PHENYLEPHRINE 80 MCG/ML (10ML) SYRINGE FOR IV PUSH (FOR BLOOD PRESSURE SUPPORT): 80.0000 ug | PREFILLED_SYRINGE | INTRAVENOUS | Status: DC | PRN

## 2022-11-24 MED ORDER — FENTANYL-BUPIVACAINE-NACL 0.5-0.125-0.9 MG/250ML-% EP SOLN
12.0000 mL/h | EPIDURAL | Status: DC | PRN
  Administered 2022-11-24: 12 mL/h via EPIDURAL
  Filled 2022-11-24: qty 250

## 2022-11-24 MED ORDER — PRENATAL MULTIVITAMIN CH
1.0000 | ORAL_TABLET | Freq: Every day | ORAL | Status: DC
  Administered 2022-11-25 – 2022-11-26 (×2): 1 via ORAL
  Filled 2022-11-24 (×2): qty 1

## 2022-11-24 MED ORDER — OXYTOCIN-SODIUM CHLORIDE 30-0.9 UT/500ML-% IV SOLN
2.5000 [IU]/h | INTRAVENOUS | Status: DC
  Filled 2022-11-24: qty 500

## 2022-11-24 MED ORDER — TERBUTALINE SULFATE 1 MG/ML IJ SOLN: 0.2500 mg | Freq: Once | INTRAMUSCULAR | Status: DC | PRN

## 2022-11-24 MED ORDER — LACTATED RINGERS IV SOLN: INTRAVENOUS | Status: DC

## 2022-11-24 MED ORDER — ONDANSETRON HCL 4 MG/2ML IJ SOLN: 4.0000 mg | Freq: Four times a day (QID) | INTRAMUSCULAR | Status: DC | PRN

## 2022-11-24 MED ORDER — IBUPROFEN 600 MG PO TABS
600.0000 mg | ORAL_TABLET | Freq: Four times a day (QID) | ORAL | Status: DC
  Administered 2022-11-24 – 2022-11-26 (×8): 600 mg via ORAL
  Filled 2022-11-24 (×8): qty 1

## 2022-11-24 MED ORDER — DOCUSATE SODIUM 100 MG PO CAPS
100.0000 mg | ORAL_CAPSULE | Freq: Two times a day (BID) | ORAL | Status: DC
  Administered 2022-11-25 – 2022-11-26 (×3): 100 mg via ORAL
  Filled 2022-11-24 (×3): qty 1

## 2022-11-24 MED ORDER — DIPHENHYDRAMINE HCL 50 MG/ML IJ SOLN: 12.5000 mg | INTRAMUSCULAR | Status: DC | PRN

## 2022-11-24 MED ORDER — LIDOCAINE HCL (PF) 1 % IJ SOLN: 30.0000 mL | INTRAMUSCULAR | Status: DC | PRN

## 2022-11-24 MED ORDER — TETANUS-DIPHTH-ACELL PERTUSSIS 5-2.5-18.5 LF-MCG/0.5 IM SUSY: 0.5000 mL | PREFILLED_SYRINGE | Freq: Once | INTRAMUSCULAR | Status: DC

## 2022-11-24 MED ORDER — OXYTOCIN-SODIUM CHLORIDE 30-0.9 UT/500ML-% IV SOLN
1.0000 m[IU]/min | INTRAVENOUS | Status: DC
  Administered 2022-11-24: 2 m[IU]/min via INTRAVENOUS

## 2022-11-24 MED ORDER — SIMETHICONE 80 MG PO CHEW
80.0000 mg | CHEWABLE_TABLET | ORAL | Status: DC | PRN
  Administered 2022-11-25 (×2): 80 mg via ORAL
  Filled 2022-11-24 (×2): qty 1

## 2022-11-24 NOTE — Anesthesia Preprocedure Evaluation (Signed)
Anesthesia Evaluation  Patient identified by MRN, date of birth, ID band Patient awake    Reviewed: Allergy & Precautions, Patient's Chart, lab work & pertinent test results  History of Anesthesia Complications Negative for: history of anesthetic complications  Airway Mallampati: III  TM Distance: >3 FB Neck ROM: Full    Dental  (+) Dental Advisory Given   Pulmonary neg pulmonary ROS   breath sounds clear to auscultation       Cardiovascular negative cardio ROS  Rhythm:Regular     Neuro/Psych negative neurological ROS  negative psych ROS   GI/Hepatic negative GI ROS, Neg liver ROS,,,  Endo/Other  negative endocrine ROS    Renal/GU negative Renal ROS     Musculoskeletal negative musculoskeletal ROS (+)    Abdominal   Peds  Hematology negative hematology ROS (+) Lab Results      Component                Value               Date                      WBC                      10.4                11/24/2022                HGB                      12.8                11/24/2022                HCT                      37.2                11/24/2022                MCV                      83.8                11/24/2022                PLT                      221                 11/24/2022              Anesthesia Other Findings   Reproductive/Obstetrics (+) Pregnancy                             Anesthesia Physical Anesthesia Plan  ASA: 2  Anesthesia Plan: Epidural   Post-op Pain Management:    Induction:   PONV Risk Score and Plan: 2 and Treatment may vary due to age or medical condition  Airway Management Planned: Natural Airway  Additional Equipment: None  Intra-op Plan:   Post-operative Plan:   Informed Consent: I have reviewed the patients History and Physical, chart, labs and discussed the procedure including the risks, benefits and alternatives for the proposed anesthesia  with the patient or authorized representative who has indicated  his/her understanding and acceptance.       Plan Discussed with:   Anesthesia Plan Comments:        Anesthesia Quick Evaluation

## 2022-11-24 NOTE — Anesthesia Procedure Notes (Addendum)
Epidural Patient location during procedure: OB Start time: 11/24/2022 12:00 PM End time: 11/24/2022 12:16 PM  Staffing Anesthesiologist: Oleta Mouse, MD Performed: anesthesiologist   Preanesthetic Checklist Completed: patient identified, IV checked, risks and benefits discussed, monitors and equipment checked, pre-op evaluation and timeout performed  Epidural Patient position: sitting Prep: DuraPrep Patient monitoring: heart rate, continuous pulse ox and blood pressure Approach: midline Location: L4-L5 Injection technique: LOR saline  Needle:  Needle type: Tuohy  Needle gauge: 17 G Needle length: 9 cm Needle insertion depth: 8 cm Catheter type: closed end flexible Catheter size: 19 Gauge Catheter at skin depth: 15 cm Test dose: negative and 2% lidocaine with Epi 1:200 K  Assessment Events: blood not aspirated, no cerebrospinal fluid, injection not painful, no injection resistance, no paresthesia and negative IV test  Additional Notes Reason for block:procedure for pain

## 2022-11-24 NOTE — Plan of Care (Signed)
Problem: Education: Goal: Knowledge of General Education information will improve Description: Including pain rating scale, medication(s)/side effects and non-pharmacologic comfort measures Outcome: Progressing   Problem: Health Behavior/Discharge Planning: Goal: Ability to manage health-related needs will improve Outcome: Progressing   Problem: Clinical Measurements: Goal: Ability to maintain clinical measurements within normal limits will improve Outcome: Progressing Goal: Will remain free from infection Outcome: Progressing Goal: Diagnostic test results will improve Outcome: Progressing Goal: Respiratory complications will improve Outcome: Progressing Goal: Cardiovascular complication will be avoided Outcome: Progressing   Problem: Activity: Goal: Risk for activity intolerance will decrease Outcome: Progressing   Problem: Nutrition: Goal: Adequate nutrition will be maintained Outcome: Progressing   Problem: Coping: Goal: Level of anxiety will decrease Outcome: Progressing   Problem: Elimination: Goal: Will not experience complications related to bowel motility Outcome: Progressing Goal: Will not experience complications related to urinary retention Outcome: Progressing   Problem: Pain Managment: Goal: General experience of comfort will improve Outcome: Progressing   Problem: Safety: Goal: Ability to remain free from injury will improve Outcome: Progressing   Problem: Skin Integrity: Goal: Risk for impaired skin integrity will decrease Outcome: Progressing   Problem: Education: Goal: Knowledge of Childbirth will improve Outcome: Progressing Goal: Ability to make informed decisions regarding treatment and plan of care will improve Outcome: Progressing Goal: Ability to state and carry out methods to decrease the pain will improve Outcome: Progressing Goal: Individualized Educational Video(s) Outcome: Progressing   Problem: Coping: Goal: Ability to  verbalize concerns and feelings about labor and delivery will improve Outcome: Progressing   Problem: Life Cycle: Goal: Ability to make normal progression through stages of labor will improve Outcome: Progressing Goal: Ability to effectively push during vaginal delivery will improve Outcome: Progressing   Problem: Role Relationship: Goal: Will demonstrate positive interactions with the child Outcome: Progressing   Problem: Safety: Goal: Risk of complications during labor and delivery will decrease Outcome: Progressing   Problem: Pain Management: Goal: Relief or control of pain from uterine contractions will improve Outcome: Progressing   Problem: Education: Goal: Knowledge of Childbirth will improve Outcome: Progressing Goal: Ability to make informed decisions regarding treatment and plan of care will improve Outcome: Progressing Goal: Ability to state and carry out methods to decrease the pain will improve Outcome: Progressing Goal: Individualized Educational Video(s) Outcome: Progressing   Problem: Coping: Goal: Ability to verbalize concerns and feelings about labor and delivery will improve Outcome: Progressing   Problem: Life Cycle: Goal: Ability to make normal progression through stages of labor will improve Outcome: Progressing Goal: Ability to effectively push during vaginal delivery will improve Outcome: Progressing   Problem: Role Relationship: Goal: Will demonstrate positive interactions with the child Outcome: Progressing   Problem: Safety: Goal: Risk of complications during labor and delivery will decrease Outcome: Progressing   Problem: Education: Goal: Knowledge of condition will improve Outcome: Progressing Goal: Individualized Educational Video(s) Outcome: Progressing Goal: Individualized Newborn Educational Video(s) Outcome: Progressing   Problem: Activity: Goal: Will verbalize the importance of balancing activity with adequate rest  periods Outcome: Progressing Goal: Ability to tolerate increased activity will improve Outcome: Progressing   Problem: Coping: Goal: Ability to identify and utilize available resources and services will improve Outcome: Progressing   Problem: Life Cycle: Goal: Chance of risk for complications during the postpartum period will decrease Outcome: Progressing   Problem: Role Relationship: Goal: Ability to demonstrate positive interaction with newborn will improve Outcome: Progressing   Problem: Skin Integrity: Goal: Demonstration of wound healing without infection will improve Outcome: Progressing

## 2022-11-24 NOTE — H&P (Signed)
Christine Alexander is a 27 y.o. female [id number] 51w1dpresenting for induction of labor for unstable lie. Noted to move from vertex to transverse and back to vertex during BPP on 2/9.   She reports no LOF, VB, Contractions. Normal FM.   Pregnancy c/b: Increased BMI: initial BMI 38, 33 week growth scan:  Growth scan: EFW 43% (4lb12oz) History of cholestasis in previous pregnancy: did complain of itching at 37 week visit, bile acids normal.  AST wnl, ALT mildly elevated at 57. Itching since resolved.  Patient called over weekend reporting her children have oral HSV outbreak and she was starting to feel some tingling. She was started on valtrex 1g BID x 7 days on Friday. She states sensation has resolved and no lesions noted.   OB History     Gravida  4   Para  3   Term  3   Preterm      AB      Living  3      SAB      IAB      Ectopic      Multiple  0   Live Births  3          Past Medical History:  Diagnosis Date   Cholestasis during pregnancy    Medical history non-contributory    Past Surgical History:  Procedure Laterality Date   CHOLECYSTECTOMY  2015   Family History: family history includes Hypertension in her father. Social History:  reports that she has never smoked. She has never used smokeless tobacco. She reports that she does not drink alcohol and does not use drugs.     Maternal Diabetes: No Genetic Screening: Normal Maternal Ultrasounds/Referrals: Normal Fetal Ultrasounds or other Referrals:  None Maternal Substance Abuse:  No Significant Maternal Medications:  Meds include: Other:  valtrex Significant Maternal Lab Results:  Group B Strep negative Other Comments:  None  Review of Systems Per HPI Exam Physical Exam  Dilation: 3 Effacement (%): 70 Station: -3 Blood pressure 111/70, pulse 86, resp. rate 17, height 5' 3"$  (1.6 m), weight 107 kg, last menstrual period 01/01/2022, unknown if currently breastfeeding. Gen: NAD, resting comfortably CVS:  normal pulses Lungs: nonlabored respirations Abd: Gravid abdomen, Leopols 7.5# Ext: No calf edema or tenderness  Bedside UKoreaby me to confirm presentation: vertex  Fetal testing: 115bpm, mod variability, + accels, no decels Toco: ctx q 5 mins  Prenatal labs: ABO, Rh:  --/--/O NEG (02/12 0752) Antibody: NEG (02/12 0752) Rubella: Immune (08/04 0000) RPR: Nonreactive (08/04 0000)  HBsAg: Negative (08/04 0000)  HIV: Non-reactive (08/04 0000)  GBS: Negative/-- (01/19 0000)   Assessment/Plan: [id number]@ 338w1dIOL unstable lie Fetal wellbeing: cat I tracing IOL: pitocin started, plan AROM when fetal station a little lower. Pelvis proven to 8lb1oz Unstable lie: abdominal binder on, recheck position prior to arom Pain control: epidural upon patient request   MiRowland Lathe/09/2023, 10:22 AM

## 2022-11-24 NOTE — Lactation Note (Signed)
This note was copied from a baby's chart. Lactation Consultation Note  Patient Name: Christine Alexander [id number] Date: 11/24/2022 Reason for consult: Initial assessment;Term Age:29 hours Experienced BF mom BF each of her children 2 yrs each. Mom stated this baby has latched well but right now her milk isn't in so she will mainly formula feed until her milk comes in then she switches to breast feeding. Encouraged mom to put baby to the breast now as well.  Mom stated she has no questions or concerns and will call if needs assistance. Mom encouraged to feed baby 8-12 times/24 hours and with feeding cues.   Maternal Data Does the patient have breastfeeding experience prior to this delivery?: Yes How long did the patient breastfeed?: 2 yrs each until she got pregnant w/the next child. 6, 4,2 for 2 yrs each.  Feeding    LATCH Score                    Lactation Tools Discussed/Used    Interventions    Discharge    Consult Status Consult Status: Complete Date: 11/24/22    Theodoro Kalata 11/24/2022, 8:52 PM

## 2022-11-24 NOTE — Progress Notes (Signed)
OB Progress Note  S: Pt comfortable with epidural   O: Today's Vitals   11/24/22 1220 11/24/22 1225 11/24/22 1230 11/24/22 1239  BP:   132/79   Pulse:   80   Resp:      Temp:    98.4 F (36.9 C)  TempSrc:    Axillary  SpO2: 97% 97%    Weight:      Height:      PainSc:       Body mass index is 41.81 kg/m.  SVE 3/60/-2 AROM clear fluid  FHR: 115bpm, mod variability, + accels, no decels Toco: ctx q 5 mins   A/P:  26Y [id number] @ [redacted]w[redacted]d IOL unstable lie Fetal wellbeing: cat I tracing IOL: s/p AROM, continue pitocin, currently at 667mmin. Anticipate SVD Pain control: epidural  M. MaBrien MatesMD 11/24/22 1:25 PM

## 2022-11-25 LAB — CBC
HCT: 33.6 % — ABNORMAL LOW (ref 36.0–46.0)
Hemoglobin: 11.6 g/dL — ABNORMAL LOW (ref 12.0–15.0)
MCH: 29.1 pg (ref 26.0–34.0)
MCHC: 34.5 g/dL (ref 30.0–36.0)
MCV: 84.4 fL (ref 80.0–100.0)
Platelets: 186 10*3/uL (ref 150–400)
RBC: 3.98 MIL/uL (ref 3.87–5.11)
RDW: 14.2 % (ref 11.5–15.5)
WBC: 11.3 10*3/uL — ABNORMAL HIGH (ref 4.0–10.5)
nRBC: 0 % (ref 0.0–0.2)

## 2022-11-25 MED ORDER — VALACYCLOVIR HCL 500 MG PO TABS
1000.0000 mg | ORAL_TABLET | Freq: Two times a day (BID) | ORAL | Status: DC
  Filled 2022-11-25: qty 2

## 2022-11-25 NOTE — Lactation Note (Signed)
This note was copied from a baby's chart. Lactation Consultation Note  Patient Name: Christine Alexander [id number] Date: 11/25/2022 Age: 29 hours  Reason for consult: Follow-up assessment;Term;Infant weight loss (3 % weight loss, RH incompatibility,) - @ 16 hours - Skin Bili - 7.9  AS LC entered the room, mom pumping the right breast with the DEBP with 15 ml EBM yield , LC praised mom for her pumping.  Baby awake, LC checked and changed a med - large past black stool and baby proceeded to wet the entire bed.  Mom latched the baby on the left breast with support, depth and flanged lips. Multiple swallows and increased with breast compressions. Baby fed 16 mlins , nipple well rounded when baby released. Per mom was comfortable with feeding. Latch score 10  After breast feeding mom feeding the baby the EBM from the bottle with slow flow , baby tolerating well.  Per mom wants to go home today, but may not be able to due to the Bili level.  LC reviewed BF D/C teaching just in case there is a repeat bili and its ok and resources.  LC recommended for now since the weight loss is only at 3 % , feed both breast, and supplement with EBM , and the post pump 15 mins , save milk for the next feeding.  Once stooling increases, Bili ok, feed the baby at the breast ,  if still hungry after the 1st breast offer the 2nd breast.   Mom is and experienced BF of 3 others and was receptive to the [facility] plan .  LC updated the doc flow sheets and per mom per grandmother the baby had a large stool at 1 am.   Maternal Data Has patient been taught Hand Expression?: Yes  Feeding Mother's Current Feeding Choice: Breast Milk and Formula Nipple Type: Slow - flow  LATCH Score Latch: Grasps breast easily, tongue down, lips flanged, rhythmical sucking.  Audible Swallowing: Spontaneous and intermittent  Type of Nipple: Everted at rest and after stimulation  Comfort (Breast/Nipple): Soft / non-tender  Hold (Positioning): No  assistance needed to correctly position infant at breast.  LATCH Score: 10   Lactation Tools Discussed/Used Tools: Pump Breast pump type: Double-Electric Breast Pump Pump Education: Milk Storage;Other (comment) (RN set up the DEBP) Pumping frequency: LC recommended until the baby is stooling increased to post pump after a feeding when the baby is settled Pumped volume: 15 mL  Interventions Interventions: Breast feeding basics reviewed;Skin to skin;Breast massage;Breast compression;Adjust position;Support pillows;Position options  Discharge Pump: Manual;Personal;Hands Free  Consult Status Consult Status: Follow-up Date: 11/26/22 Follow-up type: In-patient    [place] 11/25/2022, 1:27 PM

## 2022-11-25 NOTE — Anesthesia Postprocedure Evaluation (Signed)
Anesthesia Post Note  Patient: Christine Alexander  Procedure(s) Performed: AN AD HOC LABOR EPIDURAL     Patient location during evaluation: Mother Baby Anesthesia Type: Epidural Level of consciousness: awake and alert Pain management: pain level controlled Vital Signs Assessment: post-procedure vital signs reviewed and stable Respiratory status: spontaneous breathing, nonlabored ventilation and respiratory function stable Cardiovascular status: stable Postop Assessment: no headache, no backache and epidural receding Anesthetic complications: no   No notable events documented.  Last Vitals:  Vitals:   11/25/22 0100 11/25/22 0524  BP: 117/69 112/76  Pulse: 85 90  Resp: 18 18  Temp: 36.6 C 36.6 C  SpO2: 98% 99%    Last Pain:  Vitals:   11/25/22 0820  TempSrc:   PainSc: 0-No pain   Pain Goal:                   [name]

## 2022-11-25 NOTE — Progress Notes (Signed)
POSTPARTUM PROGRESS NOTE  Post Partum Day #1  Subjective:  No acute events overnight.  Pt denies problems with voiding or po intake.  She denies nausea or vomiting.  Pain is well controlled.  Lochia Minimal. Some numbness in left leg, denies swelling/redness/calf pain. Otherwise no complaints  Objective: Blood pressure 112/76, pulse 90, temperature 97.9 F (36.6 C), temperature source Oral, resp. rate 18, height 5' 3"$  (1.6 m), weight 107 kg, last menstrual period 01/01/2022, SpO2 99 %, unknown if currently breastfeeding.  Physical Exam:  General: alert, cooperative and no distress Lochia:normal flow Chest: CTAB Heart: RRR no m/r/g Abdomen: +BS, soft, nontender Uterine Fundus: firm, 2cm below umbilicus Extremities: trace edema, neg calf TTP BL, neg Homans BL. Benign exam x2  Recent Labs    11/24/22 1014 11/25/22 0539  HGB 12.8 11.6*  HCT 37.2 33.6*    Assessment/Plan:  ASSESSMENT: Christine Alexander is a 27 y.o. [id number] s/p SVD @ [redacted]w[redacted]d PNC c/b BMI 41, prior cholestasis, oral HSV in home.   Discharge home and Breastfeeding Children with oral HSV - no lesions, can continue valtrex 1g BID course sent in on 2/9 as ppx. Good hand hygiene reviewed Encourage ambulation/OOB to chair today. Benign exam this AM.  Anticipate DC tomorrow   LOS: 1 day

## 2022-11-25 NOTE — Social Work (Addendum)
CSW received consult for hx of Depression. CSW met with MOB to offer support and complete assessment.    CSW met with MOB at bedside and introduced CSW role. MOB presented calm and welcomed CSW visit. CSW observed MOB sitting up in the room, infant asleep in the bassinet and maternal grandmother at bedside. CSW offered MOB privacy. MOB gave CSW permission to share information with maternal grandmother present. CSW inquired how MOB has felt since giving birth. MOB reported that she has been feeling good and the L&D experience was okay. CSW inquired MOB history of depression. MOB denied history of depression. MOB reported during routine visit she completed a postpartum progress screening but felt her sad emotions were hormonal. MOB reported she was prescribed SSRI Fluoxetine at the time to treat symptoms but did not take it. MOB reported she felt fine during the pregnancy with no concerns.   CSW provided education regarding the baby blues period vs. perinatal mood disorders, discussed treatment and gave resources for mental health follow up if concerns arise.  CSW recommended MOB complete a self-evaluation during the postpartum time period using the New Mom Checklist from Postpartum Progress and encouraged MOB to contact a medical professional if symptoms are noted at any time. CSW assessed MOB for safety. MOB denied SI/HI and domestic violence.   CSW provided review of Sudden Infant Death Syndrome (SIDS) precautions. MOB reported she has all essential items to care for the infant including a crib where the infant will sleep. MOB has chosen [employer] for the infant's follow up care.   CSW identifies no further need for intervention and no barriers to discharge at this time.  Kathrin Greathouse, MSW, LCSW [facility] [place] Worker  11/25/2022  12:01 PM

## 2022-11-26 LAB — RH IG WORKUP (INCLUDES ABO/RH)
Fetal Screen: NEGATIVE
Gestational Age(Wks): 39
Unit division: 0

## 2022-11-26 NOTE — Progress Notes (Signed)
Patient is doing well.  She is ambulating, voiding, tolerating PO.  Pain control is good.  Lochia is appropriate  Vitals:   11/25/22 0100 11/25/22 0524 11/25/22 1251 11/26/22 0505  BP: 117/69 112/76 128/77 115/77  Pulse: 85 90 74 74  Resp: 18 18 18 16  $ Temp: 97.8 F (36.6 C) 97.9 F (36.6 C) 98.5 F (36.9 C) 97.7 F (36.5 C)  TempSrc: Axillary Oral Oral Oral  SpO2: 98% 99% 99% 100%  Weight:      Height:        NAD Fundus firm Ext: 1+ edema bilaterally  Lab Results  Component Value Date   WBC 11.3 (H) 11/25/2022   HGB 11.6 (L) 11/25/2022   HCT 33.6 (L) 11/25/2022   MCV 84.4 11/25/2022   PLT 186 11/25/2022    --/--/O NEG (02/12 0752)/  A/P 27 y.o. Christine Alexander PPD#2. Routine care.   Rh negative: s/p rhogam Expect d/c today--meeting all goals.    [place]

## 2022-11-26 NOTE — Lactation Note (Signed)
This note was copied from a baby's chart. Lactation Consultation Note  Patient Name: Christine Alexander [id number] Date: 11/26/2022 Age : 29 hours old  Reason for consult: Follow-up assessment;Term;Infant weight loss (4 % weight loss, LC reviewed and comfirmed the doc flow sheets with mom.) Per mom the baby recently fed a bottle. Baby presently sound asleep.  Per mom remembers the breast feeding D/C teaching from yesterday and did not need a review.  LC recommended if the bilirubin stays up, to definitely add the post pumping to enhance stooling to decrease bilirubin.  Weight loss WNL.     Maternal Data    Feeding Mother's Current Feeding Choice: Breast Milk and Formula Nipple Type: Slow - flow  LATCH Score     Lactation Tools Discussed/Used Breast pump type: Double-Electric Breast Pump Pump Education: Milk Storage Pumped volume: 25 mL (per mom has pumped off 15 ml and 10 ml)  Interventions Interventions: Breast feeding basics reviewed;Education;DEBP;Hand pump;LC Services brochure  Discharge Discharge Education: Engorgement and breast care Pump: Manual;Hands Free;Personal WIC Program: No  Consult Status Consult Status: Complete Date: 11/26/22    Myer Haff 11/26/2022, 9:07 AM

## 2022-11-26 NOTE — Discharge Summary (Signed)
Postpartum Discharge Summary  Date of Service updated 11/26/22     Patient Name: Christine Alexander DOB: 11/11/1995 MRN: [id number]  Date of admission: 11/24/2022 Delivery date:11/24/2022  Delivering provider: Irene Pap E  Date of discharge: 11/26/2022  Admitting diagnosis: Unstable lie of fetus [O32.0XX0] Intrauterine pregnancy: [redacted]w[redacted]d    Secondary diagnosis:  Principal Problem:   Unstable lie of fetus  Additional problems: n/a    Discharge diagnosis: Term Pregnancy Delivered                                              Post partum procedures: n/a Augmentation: AROM and Pitocin Complications: None  Hospital course: Induction of Labor With Vaginal Delivery   27y.o. yo [id number] 350w1das admitted to the hospital 11/24/2022 for induction of labor.  Indication for induction:  variable lie.  She was found to be transverse during a BPP at 3857w5dGiven 3 prior SVDs, was scheduled for ECV on 2/12.  On arrival was vertex .  Patient had an labor course complicated by n/a Membrane Rupture Time/Date: 12:10 PM ,11/24/2022   Delivery Method:Vaginal, Spontaneous  Episiotomy:   Lacerations:  None  Details of delivery can be found in separate delivery note.  Patient had a postpartum course complicated by n/a. Patient is discharged home 11/26/22.  Newborn Data: Birth date:11/24/2022  Birth time:2:03 PM  Gender:Female  Living status:Living  Apgars:8 ,9  Weight:3620 g     Physical exam  Vitals:   11/25/22 0100 11/25/22 0524 11/25/22 1251 11/26/22 0505  BP: 117/69 112/76 128/77 115/77  Pulse: 85 90 74 74  Resp: 18 18 18 16  $ Temp: 97.8 F (36.6 C) 97.9 F (36.6 C) 98.5 F (36.9 C) 97.7 F (36.5 C)  TempSrc: Axillary Oral Oral Oral  SpO2: 98% 99% 99% 100%  Weight:      Height:       General: alert, cooperative, and no distress Lochia: appropriate Uterine Fundus: firm Incision: N/A DVT Evaluation: No evidence of DVT seen on physical exam. Labs: Lab Results  Component  Value Date   WBC 11.3 (H) 11/25/2022   HGB 11.6 (L) 11/25/2022   HCT 33.6 (L) 11/25/2022   MCV 84.4 11/25/2022   PLT 186 11/25/2022      Latest Ref Rng & Units 11/24/2022   10:14 AM  CMP  Glucose 70 - 99 mg/dL 94   BUN 6 - 20 mg/dL 7   Creatinine 0.44 - 1.00 mg/dL 0.52   Sodium 135 - 145 mmol/L 135   Potassium 3.5 - 5.1 mmol/L 3.9   Chloride 98 - 111 mmol/L 106   CO2 22 - 32 mmol/L 21   Calcium 8.9 - 10.3 mg/dL 8.6   Total Protein 6.5 - 8.1 g/dL 6.4   Total Bilirubin 0.3 - 1.2 mg/dL 0.5   Alkaline Phos 38 - 126 U/L 105   AST 15 - 41 U/L 25   ALT 0 - 44 U/L 31    Edinburgh Score:    11/25/2022    5:24 AM  Edinburgh Postnatal Depression Scale Screening Tool  I have been able to laugh and see the funny side of things. 0  I have looked forward with enjoyment to things. 0  I have blamed myself unnecessarily when things went wrong. 0  I have been anxious or worried for no good reason.  0  I have felt scared or panicky for no good reason. 0  Things have been getting on top of me. 0  I have been so unhappy that I have had difficulty sleeping. 0  I have felt sad or miserable. 0  I have been so unhappy that I have been crying. 0  The thought of harming myself has occurred to me. 0  Edinburgh Postnatal Depression Scale Total 0      After visit meds:  Allergies as of 11/26/2022       Reactions   Morphine Itching   Morphine And Related Itching        Medication List     STOP taking these medications    azithromycin 250 MG tablet Commonly known as: ZITHROMAX   Jencycla 0.35 MG tablet Generic drug: norethindrone   metFORMIN 500 MG tablet Commonly known as: GLUCOPHAGE       TAKE these medications    acetaminophen 500 MG tablet Commonly known as: TYLENOL Take 500 mg by mouth every 6 (six) hours as needed for mild pain.   prenatal multivitamin Tabs tablet Take 1 tablet by mouth daily at 12 noon.         Discharge home in stable condition Infant  Feeding: Breast Infant Disposition:home with mother Discharge instruction: per After Visit Summary and Postpartum booklet. Activity: Advance as tolerated. Pelvic rest for 6 weeks.  Diet: routine diet Anticipated Birth Control: Unsure Postpartum Appointment:4 weeks Additional Postpartum F/U:  n/a Future Appointments:No future appointments. Follow up Visit:      11/26/2022 [facility] GEFFEL Carlis Abbott, MD

## 2022-12-06 ENCOUNTER — Telehealth (HOSPITAL_COMMUNITY): Payer: Self-pay

## 2022-12-06 NOTE — Telephone Encounter (Signed)
Patient reports feeling good. "I'm almost back to normal. My bleeding has almost stopped." Patient declines questions/concerns about her health and healing.  Patient reports that baby is doing good. Eating, peeing/pooping well. Baby sleeps in a bassinet. RN reviewed ABC's of safe sleep with patient. Patient declines any questions or concerns about baby.  EPDS score is 0.  Sharyn Lull [facility] 12/06/22,0908

## 2023-01-08 DIAGNOSIS — Z30430 Encounter for insertion of intrauterine contraceptive device: Secondary | ICD-10-CM | POA: Diagnosis not present

## 2023-01-08 DIAGNOSIS — Z3202 Encounter for pregnancy test, result negative: Secondary | ICD-10-CM | POA: Diagnosis not present

## 2023-01-08 DIAGNOSIS — Z6841 Body Mass Index (BMI) 40.0 and over, adult: Secondary | ICD-10-CM | POA: Diagnosis not present

## 2023-01-08 DIAGNOSIS — Z113 Encounter for screening for infections with a predominantly sexual mode of transmission: Secondary | ICD-10-CM | POA: Diagnosis not present

## 2023-01-09 DIAGNOSIS — M79671 Pain in right foot: Secondary | ICD-10-CM | POA: Diagnosis not present

## 2023-01-22 DIAGNOSIS — M722 Plantar fascial fibromatosis: Secondary | ICD-10-CM | POA: Diagnosis not present

## 2023-02-03 ENCOUNTER — Encounter: Payer: Self-pay | Admitting: Physician Assistant

## 2023-02-05 DIAGNOSIS — M76821 Posterior tibial tendinitis, right leg: Secondary | ICD-10-CM | POA: Diagnosis not present

## 2023-02-05 DIAGNOSIS — M722 Plantar fascial fibromatosis: Secondary | ICD-10-CM | POA: Diagnosis not present

## 2023-02-05 DIAGNOSIS — Z30431 Encounter for routine checking of intrauterine contraceptive device: Secondary | ICD-10-CM | POA: Diagnosis not present

## 2023-02-05 DIAGNOSIS — K625 Hemorrhage of anus and rectum: Secondary | ICD-10-CM | POA: Diagnosis not present

## 2023-02-19 DIAGNOSIS — M722 Plantar fascial fibromatosis: Secondary | ICD-10-CM | POA: Diagnosis not present

## 2023-03-26 DIAGNOSIS — Z30432 Encounter for removal of intrauterine contraceptive device: Secondary | ICD-10-CM | POA: Diagnosis not present

## 2023-03-26 DIAGNOSIS — G43909 Migraine, unspecified, not intractable, without status migrainosus: Secondary | ICD-10-CM | POA: Diagnosis not present

## 2023-03-26 DIAGNOSIS — R5383 Other fatigue: Secondary | ICD-10-CM | POA: Diagnosis not present

## 2023-04-03 ENCOUNTER — Encounter: Payer: Self-pay | Admitting: Physician Assistant

## 2023-04-07 ENCOUNTER — Ambulatory Visit: Payer: Medicaid Other | Admitting: Physician Assistant

## 2023-05-14 IMAGING — DX DG CHEST 2V
2 series · 2 of 2 positions shown · non-contrast
Comparison: None.

CLINICAL DATA: Cough and congestion for 3 weeks.

EXAM:
CHEST - 2 VIEW

[chest pa]
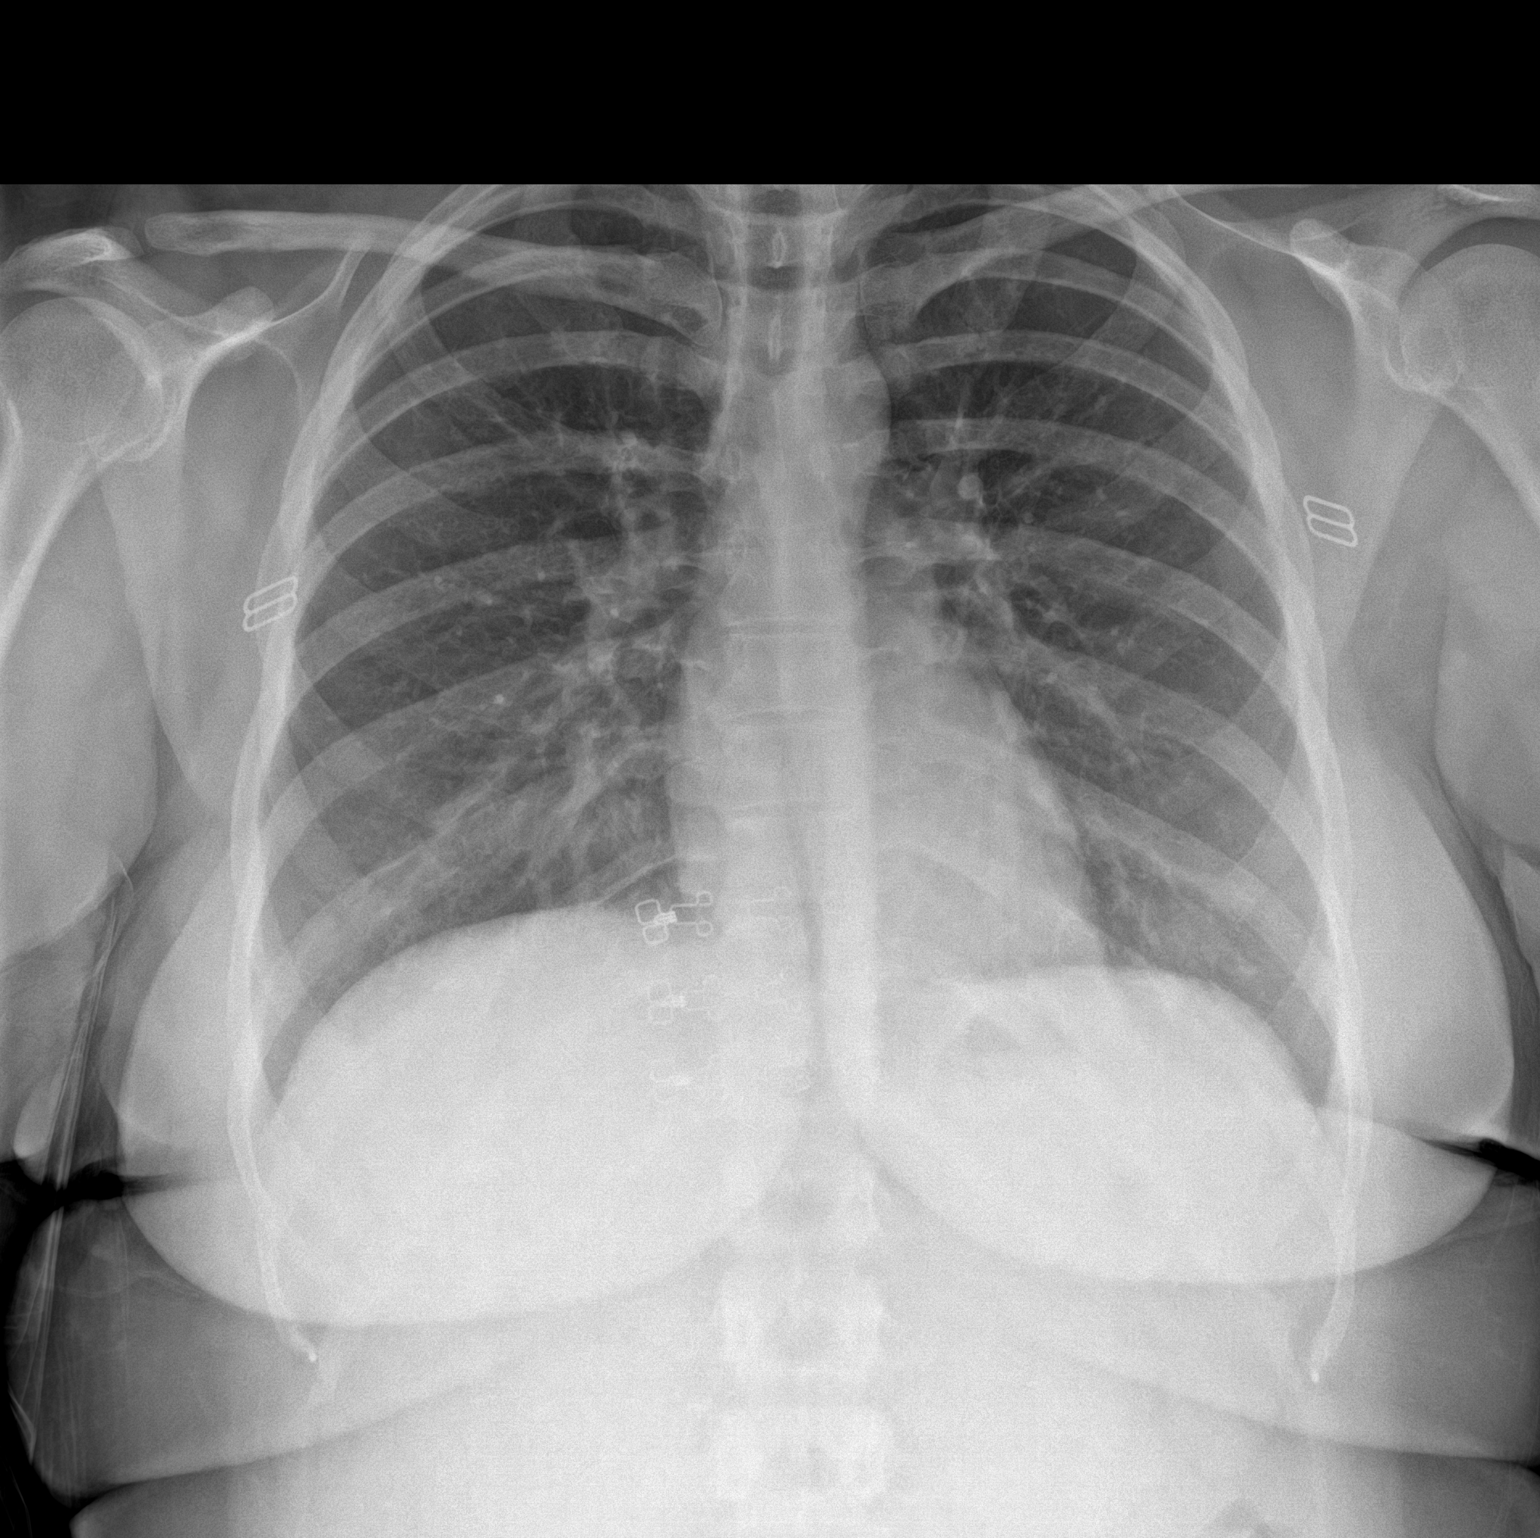

[chest lat]
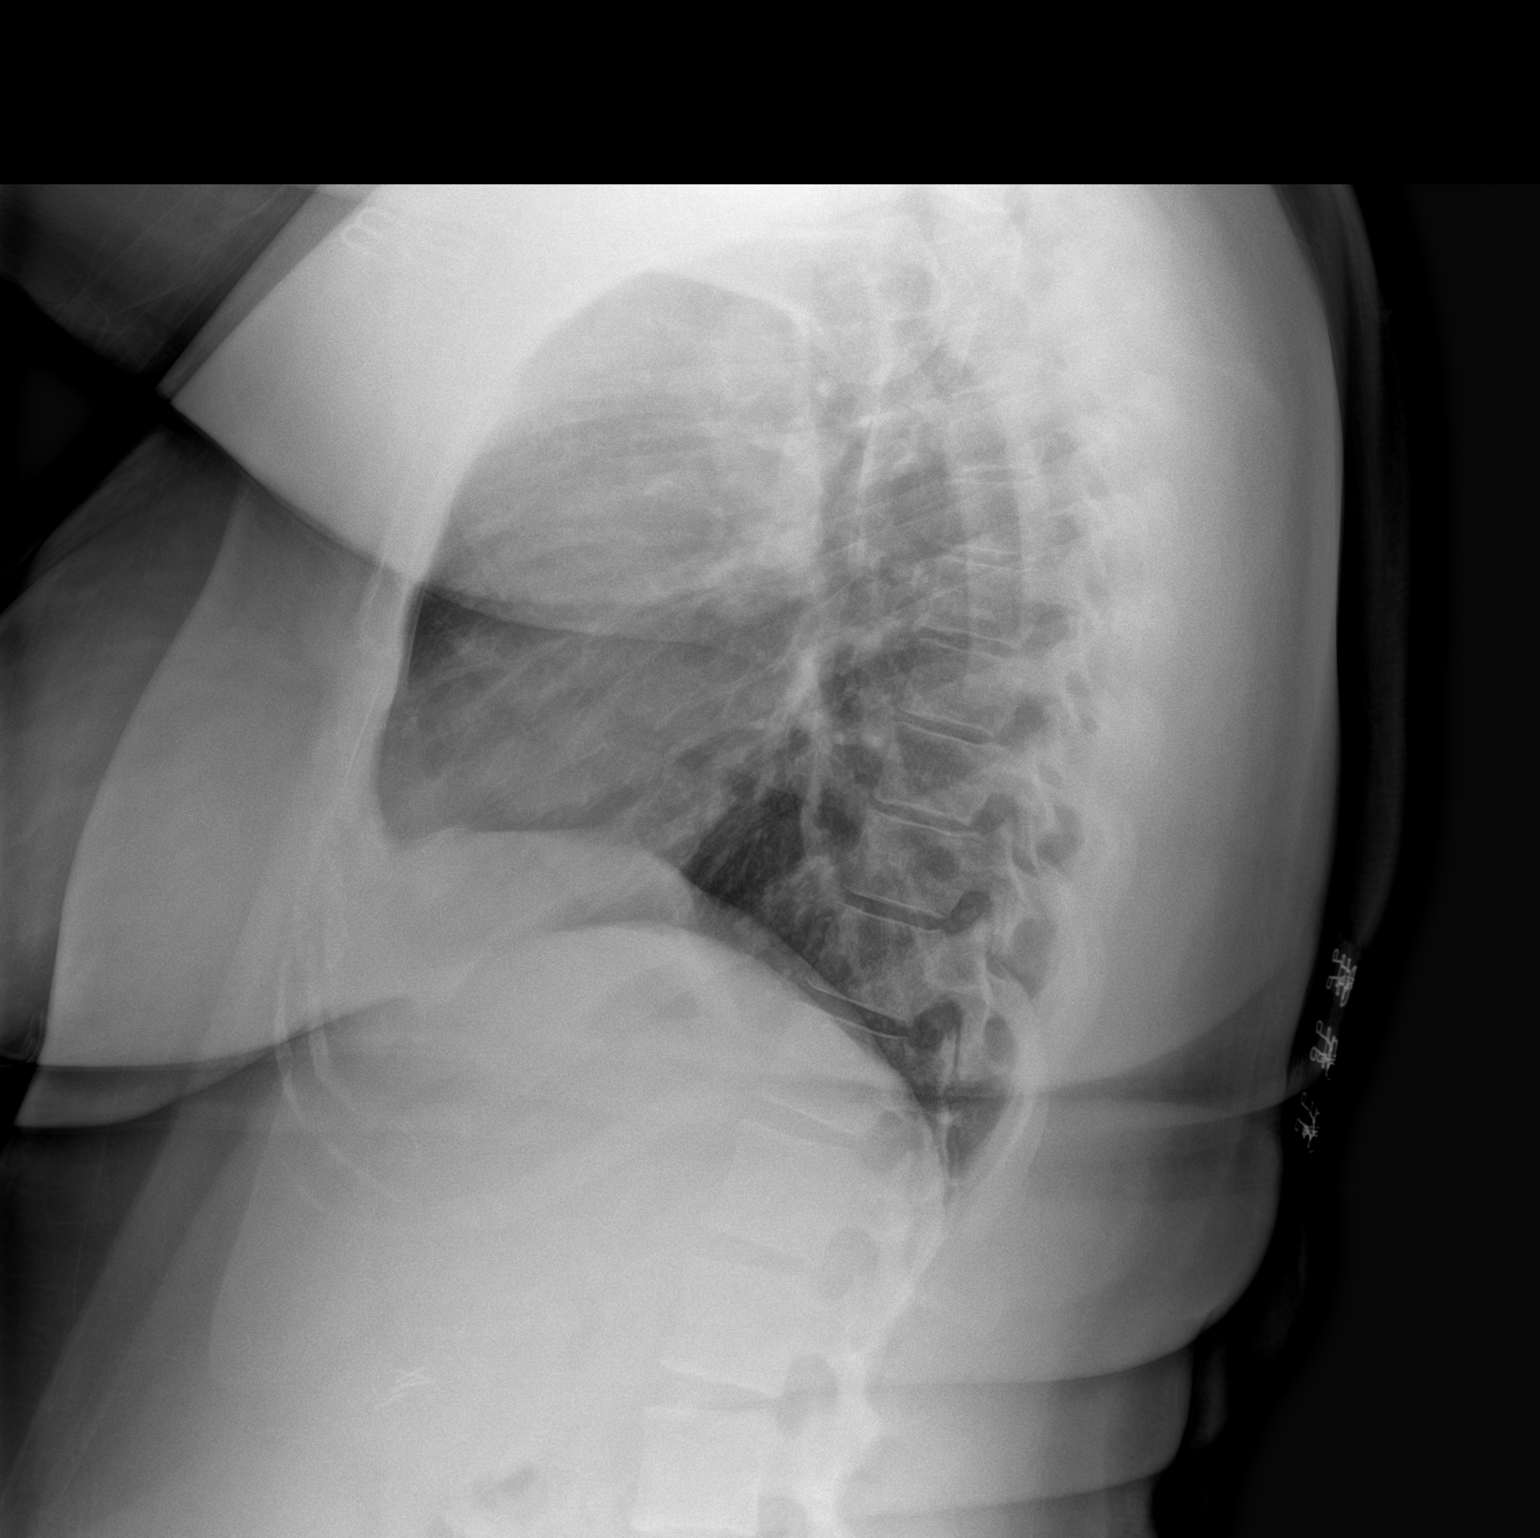

[2 of 2 positions shown; findings below may reference images not displayed]

FINDINGS: Normal heart, mediastinum and hila.

Clear lungs.

No pleural effusion or pneumothorax.

Skeletal structures are within normal limits.
IMPRESSION: Normal chest radiographs.

## 2023-05-27 DIAGNOSIS — M2142 Flat foot [pes planus] (acquired), left foot: Secondary | ICD-10-CM | POA: Diagnosis not present

## 2023-05-27 DIAGNOSIS — M2141 Flat foot [pes planus] (acquired), right foot: Secondary | ICD-10-CM | POA: Diagnosis not present

## 2023-05-27 DIAGNOSIS — M792 Neuralgia and neuritis, unspecified: Secondary | ICD-10-CM | POA: Diagnosis not present

## 2023-05-27 DIAGNOSIS — M76821 Posterior tibial tendinitis, right leg: Secondary | ICD-10-CM | POA: Diagnosis not present

## 2023-06-18 ENCOUNTER — Encounter (HOSPITAL_BASED_OUTPATIENT_CLINIC_OR_DEPARTMENT_OTHER): Payer: Self-pay | Admitting: Family Medicine

## 2023-06-18 ENCOUNTER — Ambulatory Visit (HOSPITAL_BASED_OUTPATIENT_CLINIC_OR_DEPARTMENT_OTHER): Payer: Medicaid Other | Admitting: Family Medicine

## 2023-06-18 VITALS — BP 116/76 | HR 76 | Ht 62.0 in | Wt 230.8 lb

## 2023-06-18 DIAGNOSIS — Z7689 Persons encountering health services in other specified circumstances: Secondary | ICD-10-CM | POA: Diagnosis not present

## 2023-06-18 DIAGNOSIS — M79671 Pain in right foot: Secondary | ICD-10-CM

## 2023-06-18 NOTE — Progress Notes (Signed)
New Patient Office Visit  Subjective    Patient ID: Christine Alexander, female    DOB: 01-30-1996  Age: 28 y.o. MRN: [id number]  CC:  Chief Complaint  Patient presents with   New Patient (Initial Visit)    Pt is here today to get established with the practice. Main concern is her weight that she wants to discuss. Also has been having pain in her right foot.    HPI Christine Alexander is a 27 year-old female who presents to establish care. She has concerns about her weight and right foot pain x6 months.   BMI 42.21: she is a busy mom with 4 children, most days she is often awake from 6a-12a. She reports she has been actively trying to lose weight for the past 2-3 years- focusing on diet and exercise. She reports that "no matter what I do, I don't lose any weight." She walks every day and joined a group exercise class. However, she has been unable to workout recently due to her foot pain. She is eating "clean," only eating home-cooked meals, low carbs, no sugar. She does have a history of PCOS.  Unable to eat junk food since having her gallbladder removed. She is very uncomfortable and unable to dit into her clothes. June 2024: hemoglobin A1c 5.5.  She reports she has been having R foot pain and has seen both ortho at [facility] &foot specialist ([facility]). Was given PO steroids- worked for a week and now she's back in pain  She states that the foot specialist told her that her tendon is inflamed and "meloxicam is your only hope" Has been hurting for the past 6 months, has to watch her step- she is unable to walk barefoot  Exercises have slightly improved but pain is still present  Hurts when sitting and walking    Outpatient Encounter Medications as of 06/18/2023  Medication Sig   acetaminophen (TYLENOL) 500 MG tablet Take 500 mg by mouth every 6 (six) hours as needed for mild pain.   Liraglutide -Weight Management (SAXENDA) 18 MG/3ML SOPN Inject 0.6 mg into the skin daily.    meloxicam (MOBIC) 7.5 MG tablet Take 1 tablet by mouth daily as needed.   norethindrone (MICRONOR) 0.35 MG tablet Take 1 tablet by mouth daily.   Prenatal Vit-Fe Fumarate-FA (PRENATAL MULTIVITAMIN) TABS tablet Take 1 tablet by mouth daily at 12 noon.   [DISCONTINUED] HYDROcodone-acetaminophen (NORCO/VICODIN) 5-325 MG tablet    No facility-administered encounter medications on file as of 06/18/2023.    Past Medical History:  Diagnosis Date   Cholestasis during pregnancy    Constipation    Medical history non-contributory    Polycystic ovaries     Past Surgical History:  Procedure Laterality Date   CHOLECYSTECTOMY  2015    Family History  Problem Relation Age of Onset   Hypertension Father      Review of Systems  Constitutional:  Negative for malaise/fatigue and weight loss.  Eyes:  Negative for blurred vision and double vision.  Respiratory:  Negative for cough and shortness of breath.   Cardiovascular:  Negative for chest pain, palpitations and leg swelling.  Gastrointestinal:  Negative for abdominal pain, nausea and vomiting.  Musculoskeletal:  Negative for myalgias.  Neurological:  Negative for dizziness, weakness and headaches.  Psychiatric/Behavioral:  Negative for depression and suicidal ideas. The patient is not nervous/anxious and does not have insomnia.         Objective    BP 116/76 (BP  Location: Right Arm, Patient Position: Sitting, Cuff Size: Large)   Pulse 76   Ht 5\' 2"  (1.575 m)   Wt 230 lb 12.8 oz (104.7 kg)   LMP 06/11/2023 (Exact Date)   SpO2 100%   Breastfeeding Yes   BMI 42.21 kg/m   Physical Exam Constitutional:      Appearance: Normal appearance.  Cardiovascular:     Rate and Rhythm: Normal rate and regular rhythm.     Pulses: Normal pulses.     Heart sounds: Normal heart sounds.  Pulmonary:     Effort: Pulmonary effort is normal.     Breath sounds: Normal breath sounds.  Neurological:     Mental Status: She is alert.  Psychiatric:         Mood and Affect: Mood normal.        Behavior: Behavior normal.        Thought Content: Thought content normal.        Judgment: Judgment normal.       Assessment & Plan:   1. Encounter to establish care Patient is a pleasant 27 year old female who presents today to establish care with primary care. Reviewed the past medical history, family history, social history, surgical history, medications and allergies today- updates made as indicated. She has concerns about her right foot pain and is interested in weight loss medication.   2. Right foot pain Patient has been seen both ortho at [facility] & foot specialist ([facility]). She was initially diagnosed with plantar fasciitis of the right foot, but she does not feel this is accurate due to where her pain is. Records have been requested. Will send referral to Dr. Steward Drone to see if he further recommendations or management.   3. Class 3 obesity (HCC) Discussed lifestyle modifications patient has been participating in to lose weight; however, she is interested in weight loss medication. She would like to start medication to assist with lifestyle modifications. Reviewed potential side effects with this class of medication, discussed appropriate administration as well as gradual titration moving forward. We will look to initiate GLP-1 receptor agonist, initially proceed with Saxenda. Last A1c was checked 03/2023 with a result of 5.5. If she is able to start with medication, recommend that she schedule follow-up in about 4 weeks after starting.   - Liraglutide -Weight Management (SAXENDA) 18 MG/3ML SOPN; Inject 0.6 mg into the skin daily.  Dispense: 3 mL; Refill: 0   Return in about 6 weeks (around 07/30/2023) for weight loss f/u .   Alyson Reedy, FNP

## 2023-06-19 ENCOUNTER — Telehealth (HOSPITAL_BASED_OUTPATIENT_CLINIC_OR_DEPARTMENT_OTHER): Payer: Self-pay | Admitting: Family Medicine

## 2023-06-19 ENCOUNTER — Other Ambulatory Visit (HOSPITAL_BASED_OUTPATIENT_CLINIC_OR_DEPARTMENT_OTHER): Payer: Self-pay

## 2023-06-19 NOTE — Telephone Encounter (Signed)
Patient calling to see if her prescription had been sent to the pharmacy. Please advise.

## 2023-06-20 MED ORDER — SAXENDA 18 MG/3ML ~~LOC~~ SOPN
0.6000 mg | PEN_INJECTOR | Freq: Every day | SUBCUTANEOUS | 0 refills | Status: DC
  Filled 2023-06-20: qty 3, 30d supply, fill #0

## 2023-06-21 ENCOUNTER — Other Ambulatory Visit (HOSPITAL_BASED_OUTPATIENT_CLINIC_OR_DEPARTMENT_OTHER): Payer: Self-pay

## 2023-06-22 ENCOUNTER — Other Ambulatory Visit (HOSPITAL_BASED_OUTPATIENT_CLINIC_OR_DEPARTMENT_OTHER): Payer: Self-pay

## 2023-06-22 NOTE — Telephone Encounter (Signed)
Looked at pt's chart and saw that Christine Alexander was sent to pharmacy for pt after last visit.  Attempted to call pt but unable to reach. Left message to return call.

## 2023-06-23 ENCOUNTER — Other Ambulatory Visit (HOSPITAL_BASED_OUTPATIENT_CLINIC_OR_DEPARTMENT_OTHER): Payer: Self-pay

## 2023-06-23 ENCOUNTER — Encounter (HOSPITAL_BASED_OUTPATIENT_CLINIC_OR_DEPARTMENT_OTHER): Payer: Self-pay | Admitting: Family Medicine

## 2023-06-23 MED ORDER — SAXENDA 18 MG/3ML ~~LOC~~ SOPN
0.6000 mg | PEN_INJECTOR | Freq: Every day | SUBCUTANEOUS | 0 refills | Status: DC
  Filled 2023-06-23: qty 3, 30d supply, fill #0

## 2023-06-24 ENCOUNTER — Other Ambulatory Visit (HOSPITAL_BASED_OUTPATIENT_CLINIC_OR_DEPARTMENT_OTHER): Payer: Self-pay | Admitting: Family Medicine

## 2023-06-24 ENCOUNTER — Other Ambulatory Visit (HOSPITAL_BASED_OUTPATIENT_CLINIC_OR_DEPARTMENT_OTHER): Payer: Self-pay

## 2023-06-24 MED ORDER — WEGOVY 0.25 MG/0.5ML ~~LOC~~ SOAJ: 0.2500 mg | SUBCUTANEOUS | 2 refills | Status: DC

## 2023-06-24 MED ORDER — WEGOVY 0.25 MG/0.5ML ~~LOC~~ SOAJ
0.2500 mg | SUBCUTANEOUS | 2 refills | Status: DC
  Filled 2023-06-24: qty 2, 28d supply, fill #0
  Filled 2023-07-16: qty 2, 28d supply, fill #1

## 2023-06-25 ENCOUNTER — Other Ambulatory Visit (HOSPITAL_BASED_OUTPATIENT_CLINIC_OR_DEPARTMENT_OTHER): Payer: Self-pay

## 2023-07-06 ENCOUNTER — Ambulatory Visit (HOSPITAL_BASED_OUTPATIENT_CLINIC_OR_DEPARTMENT_OTHER): Payer: Medicaid Other | Admitting: Orthopaedic Surgery

## 2023-07-16 ENCOUNTER — Other Ambulatory Visit (HOSPITAL_BASED_OUTPATIENT_CLINIC_OR_DEPARTMENT_OTHER): Payer: Self-pay | Admitting: Family Medicine

## 2023-07-16 ENCOUNTER — Encounter (HOSPITAL_BASED_OUTPATIENT_CLINIC_OR_DEPARTMENT_OTHER): Payer: Self-pay | Admitting: Family Medicine

## 2023-07-17 ENCOUNTER — Other Ambulatory Visit: Payer: Self-pay

## 2023-07-17 ENCOUNTER — Other Ambulatory Visit (HOSPITAL_BASED_OUTPATIENT_CLINIC_OR_DEPARTMENT_OTHER): Payer: Self-pay

## 2023-07-17 MED ORDER — MELOXICAM 7.5 MG PO TABS
7.5000 mg | ORAL_TABLET | Freq: Every day | ORAL | 2 refills | Status: AC | PRN
  Filled 2023-07-17: qty 30, 30d supply, fill #0
  Filled 2023-08-28: qty 30, 30d supply, fill #1

## 2023-07-30 ENCOUNTER — Other Ambulatory Visit: Payer: Self-pay

## 2023-07-30 ENCOUNTER — Ambulatory Visit (INDEPENDENT_AMBULATORY_CARE_PROVIDER_SITE_OTHER): Payer: Medicaid Other | Admitting: Family Medicine

## 2023-07-30 ENCOUNTER — Encounter (HOSPITAL_BASED_OUTPATIENT_CLINIC_OR_DEPARTMENT_OTHER): Payer: Self-pay | Admitting: Family Medicine

## 2023-07-30 ENCOUNTER — Other Ambulatory Visit (HOSPITAL_BASED_OUTPATIENT_CLINIC_OR_DEPARTMENT_OTHER): Payer: Self-pay

## 2023-07-30 VITALS — BP 128/77 | HR 69 | Ht 62.0 in | Wt 225.6 lb

## 2023-07-30 DIAGNOSIS — E66813 Obesity, class 3: Secondary | ICD-10-CM

## 2023-07-30 DIAGNOSIS — Z0000 Encounter for general adult medical examination without abnormal findings: Secondary | ICD-10-CM

## 2023-07-30 MED ORDER — SEMAGLUTIDE-WEIGHT MANAGEMENT 0.5 MG/0.5ML ~~LOC~~ SOAJ
0.5000 mg | SUBCUTANEOUS | 2 refills | Status: DC
  Filled 2023-07-30: qty 2, 28d supply, fill #0
  Filled 2023-08-28: qty 2, 28d supply, fill #1

## 2023-07-30 NOTE — Assessment & Plan Note (Signed)
Patient currently tolerating 0.25mg  weekly injection of Wegovy and is interested in increasing her dose to 0.5mg  weekly. Counseled patient about possible side effects with increasing dose and that weight loss is more significant with an increase in dose. Refill sent today. Discussed lifestyle modifications. Plan to follow-up in 4 weeks to determine if she is able to increase next dose.

## 2023-07-30 NOTE — Progress Notes (Signed)
   Established Patient Office Visit  Subjective   Patient ID: Christine Alexander, female    DOB: 15-Feb-1996  Age: 27 y.o. MRN: [id number]  Christine Alexander is a 27 year old female patient who presents for medical management of weight loss. Denies diarrhea, constipation, nausea. She does report her portion size has decreased but her hunger cues have not necessarily subsided. She is currently taking 0.25mg  injections weekly.  Her initial visit- she was 230lbs and her wt today 225lbs. She is planning on starting an exercise routine.   Review of Systems  Constitutional:  Positive for weight loss. Negative for malaise/fatigue.  Eyes:  Negative for blurred vision and double vision.  Respiratory:  Negative for cough and shortness of breath.   Cardiovascular:  Negative for chest pain, palpitations and leg swelling.  Gastrointestinal:  Negative for abdominal pain, constipation, diarrhea, heartburn, nausea and vomiting.  Musculoskeletal:  Negative for myalgias.  Neurological:  Negative for dizziness, weakness and headaches.  Psychiatric/Behavioral:  Negative for depression and suicidal ideas. The patient is not nervous/anxious and does not have insomnia.       Objective:    BP 128/77   Pulse 69   Ht 5\' 2"  (1.575 m)   Wt 225 lb 9.6 oz (102.3 kg)   SpO2 100%   Breastfeeding No   BMI 41.26 kg/m  BP Readings from Last 3 Encounters:  07/30/23 128/77  06/18/23 116/76  11/26/22 115/77    Physical Exam Constitutional:      Appearance: Normal appearance.  Cardiovascular:     Rate and Rhythm: Normal rate and regular rhythm.     Pulses: Normal pulses.     Heart sounds: Normal heart sounds.  Pulmonary:     Effort: Pulmonary effort is normal.     Breath sounds: Normal breath sounds.  Neurological:     Mental Status: She is alert.  Psychiatric:        Mood and Affect: Mood normal.        Behavior: Behavior normal.       Assessment & Plan:  Class 3 obesity Assessment & Plan: Patient currently  tolerating 0.25mg  weekly injection of Wegovy and is interested in increasing her dose to 0.5mg  weekly. Counseled patient about possible side effects with increasing dose and that weight loss is more significant with an increase in dose. Refill sent today. Discussed lifestyle modifications. Plan to follow-up in 4 weeks to determine if she is able to increase next dose.   Orders: -     Semaglutide-Weight Management; Inject 0.5 mg into the skin once a week.  Dispense: 2 mL; Refill: 2  Wellness examination Assessment & Plan: Patient would like to plan for a physical next visit. She would like to obtain fasting labs today.   Orders: -     CBC with Differential/Platelet -     Comprehensive metabolic panel -     Hemoglobin A1c -     Lipid panel -     TSH Rfx on Abnormal to Free T4    Return in about 4 weeks (around 08/27/2023) for Physical + wt loss med.    Alyson Reedy, FNP

## 2023-07-30 NOTE — Assessment & Plan Note (Signed)
Patient would like to plan for a physical next visit. She would like to obtain fasting labs today.

## 2023-07-31 LAB — LIPID PANEL
Chol/HDL Ratio: 4.1 {ratio} (ref 0.0–4.4)
Cholesterol, Total: 158 mg/dL (ref 100–199)
HDL: 39 mg/dL — ABNORMAL LOW (ref >39)
LDL Chol Calc (NIH): 102 mg/dL — ABNORMAL HIGH (ref 0–99)
Triglycerides: 90 mg/dL (ref 0–149)
VLDL Cholesterol Cal: 17 mg/dL (ref 5–40)

## 2023-07-31 LAB — COMPREHENSIVE METABOLIC PANEL
ALT: 60 [IU]/L — ABNORMAL HIGH (ref 0–32)
AST: 34 [IU]/L (ref 0–40)
Albumin: 4.7 g/dL (ref 4.0–5.0)
Alkaline Phosphatase: 105 [IU]/L (ref 44–121)
BUN/Creatinine Ratio: 16 (ref 9–23)
BUN: 12 mg/dL (ref 6–20)
Bilirubin Total: 0.7 mg/dL (ref 0.0–1.2)
CO2: 20 mmol/L (ref 20–29)
Calcium: 9.4 mg/dL (ref 8.7–10.2)
Chloride: 105 mmol/L (ref 96–106)
Creatinine, Ser: 0.73 mg/dL (ref 0.57–1.00)
Globulin, Total: 2.8 g/dL (ref 1.5–4.5)
Glucose: 88 mg/dL (ref 70–99)
Potassium: 4.2 mmol/L (ref 3.5–5.2)
Sodium: 141 mmol/L (ref 134–144)
Total Protein: 7.5 g/dL (ref 6.0–8.5)
eGFR: 115 mL/min/{1.73_m2} (ref >59)

## 2023-07-31 LAB — CBC WITH DIFFERENTIAL/PLATELET
Basophils Absolute: 0.1 10*3/uL (ref 0.0–0.2)
Basos: 1 %
EOS (ABSOLUTE): 0.3 10*3/uL (ref 0.0–0.4)
Eos: 4 %
Hematocrit: 42.2 % (ref 34.0–46.6)
Hemoglobin: 14 g/dL (ref 11.1–15.9)
Immature Grans (Abs): 0.1 10*3/uL (ref 0.0–0.1)
Immature Granulocytes: 1 %
Lymphocytes Absolute: 2.8 10*3/uL (ref 0.7–3.1)
Lymphs: 31 %
MCH: 28.6 pg (ref 26.6–33.0)
MCHC: 33.2 g/dL (ref 31.5–35.7)
MCV: 86 fL (ref 79–97)
Monocytes Absolute: 0.7 10*3/uL (ref 0.1–0.9)
Monocytes: 7 %
Neutrophils Absolute: 5.1 10*3/uL (ref 1.4–7.0)
Neutrophils: 56 %
Platelets: 277 10*3/uL (ref 150–450)
RBC: 4.89 x10E6/uL (ref 3.77–5.28)
RDW: 13.1 % (ref 11.7–15.4)
WBC: 9.1 10*3/uL (ref 3.4–10.8)

## 2023-07-31 LAB — TSH RFX ON ABNORMAL TO FREE T4: TSH: 1.4 u[IU]/mL (ref 0.450–4.500)

## 2023-07-31 LAB — HEMOGLOBIN A1C
Est. average glucose Bld gHb Est-mCnc: 117 mg/dL
Hgb A1c MFr Bld: 5.7 % — ABNORMAL HIGH (ref 4.8–5.6)

## 2023-08-29 DIAGNOSIS — R110 Nausea: Secondary | ICD-10-CM | POA: Diagnosis not present

## 2023-08-29 DIAGNOSIS — R519 Headache, unspecified: Secondary | ICD-10-CM | POA: Diagnosis not present

## 2023-09-01 ENCOUNTER — Other Ambulatory Visit (HOSPITAL_BASED_OUTPATIENT_CLINIC_OR_DEPARTMENT_OTHER): Payer: Self-pay

## 2023-09-01 ENCOUNTER — Encounter (HOSPITAL_BASED_OUTPATIENT_CLINIC_OR_DEPARTMENT_OTHER): Payer: Self-pay | Admitting: Family Medicine

## 2023-09-01 ENCOUNTER — Ambulatory Visit (HOSPITAL_BASED_OUTPATIENT_CLINIC_OR_DEPARTMENT_OTHER): Payer: Medicaid Other | Admitting: Family Medicine

## 2023-09-01 VITALS — BP 116/80 | HR 75 | Ht 62.0 in | Wt 219.3 lb

## 2023-09-01 DIAGNOSIS — Z0000 Encounter for general adult medical examination without abnormal findings: Secondary | ICD-10-CM

## 2023-09-01 DIAGNOSIS — R0981 Nasal congestion: Secondary | ICD-10-CM | POA: Diagnosis not present

## 2023-09-01 DIAGNOSIS — E66813 Obesity, class 3: Secondary | ICD-10-CM

## 2023-09-01 DIAGNOSIS — R7303 Prediabetes: Secondary | ICD-10-CM | POA: Diagnosis not present

## 2023-09-01 DIAGNOSIS — G43009 Migraine without aura, not intractable, without status migrainosus: Secondary | ICD-10-CM

## 2023-09-01 DIAGNOSIS — K219 Gastro-esophageal reflux disease without esophagitis: Secondary | ICD-10-CM

## 2023-09-01 MED ORDER — SEMAGLUTIDE (1 MG/DOSE) 4 MG/3ML ~~LOC~~ SOPN
1.0000 mg | PEN_INJECTOR | SUBCUTANEOUS | 2 refills | Status: DC
  Filled 2023-09-01: qty 3, 28d supply, fill #0

## 2023-09-01 MED ORDER — SEMAGLUTIDE-WEIGHT MANAGEMENT 1 MG/0.5ML ~~LOC~~ SOAJ
1.0000 mg | SUBCUTANEOUS | 2 refills | Status: DC
  Filled 2023-09-01 – 2023-09-09 (×3): qty 2, 28d supply, fill #0

## 2023-09-01 MED ORDER — SUMATRIPTAN SUCCINATE 50 MG PO TABS
50.0000 mg | ORAL_TABLET | ORAL | 0 refills | Status: AC | PRN
  Filled 2023-09-01: qty 9, 30d supply, fill #0

## 2023-09-01 NOTE — Progress Notes (Signed)
Established Patient Office Visit  Subjective   Patient ID: Christine Alexander, female    DOB: Jan 29, 1996  Age: 27 y.o. MRN: [id number]  Chief Complaint  Patient presents with   Annual Exam   Weight Check    Feels good on medication, would like to increase   Patient is a pleasant 27 year old female patient who presents today for the following additional concerns:   MIGRAINES- went to [facility] on 11/16 for an ongoing headache that had been present for about 5 days prior to this. She reports her migraine was so severe, unlike her previous migraine attacks. Her face was throbbing, experiencing nausea, decreased appetite, photophobia, and difficulty with concentrating/thinking. Denies improvement in OTC medications, including Excedrin, with migraine relief. She would like something to help with migraine at onset. She reports that she has about 1 migraine per month.   SINUS CONGESTION/PRESSURE- she is suffering from nasal congestion today, with postnasal drip and sputum production  Denies fever/chills, headache, ear pain, shob, cough, sore throat.  Reports that she is prone to sinus infections "around this time of year"  Reports tenderness in the T zone- especially above her eyebrows.  Reports these symptoms have been going on for the past 5 days.   DYSPEPSIA- has tried OTC omeprazole & pepcid with improvement  Noticed this BEFORE she started [place]. It used to happen once, but now it is constantly occurring.   She reports that she will burp or have flatulence throughout the day after she eats. She reports consuming a healthy diet and is unsure what is causing this. She has tried probiotics without improvement. Reports an increase in stomach activity and sounds. Denies diarrhea, constipation, acid reflux, Reports some epigastric pain with touch.   She would like to do H pylori testing at her next visit- advised her not to take OTC PPI for 2 weeks   WEIGHT LOSS MEDICATION- she has been tolerating  Wegovy 0.5mg  weekly and would like to increase the dose Reports that she is noticing an increase in gas production from both ends- but reports she had this issue prior to starting [facility]- just not as frequently. At her initial visit, 104.7kg on 06/18/2023. Wegovy started on 06/2023. Weight today is 99.5kg. She would like to increase dose today.   ROS: see above    Objective:     BP 116/80   Pulse 75   Ht 5\' 2"  (1.575 m)   Wt 219 lb 4.8 oz (99.5 kg)   LMP 08/20/2023 (Approximate) Comment: Irregular  SpO2 99%   Breastfeeding No   BMI 40.11 kg/m  BP Readings from Last 3 Encounters:  09/01/23 116/80  07/30/23 128/77  06/18/23 116/76     Physical Exam Constitutional:      Appearance: Normal appearance.  HENT:     Right Ear: Ear canal and external ear normal. No middle ear effusion. No hemotympanum. Tympanic membrane is bulging. Tympanic membrane is not injected, erythematous or retracted.     Left Ear: Ear canal and external ear normal.  No middle ear effusion. No hemotympanum. Tympanic membrane is bulging. Tympanic membrane is not injected, erythematous or retracted.     Nose: Congestion present. No mucosal edema or rhinorrhea.     Right Turbinates: Not enlarged or swollen.     Left Turbinates: Not enlarged or swollen.     Right Sinus: No maxillary sinus tenderness or frontal sinus tenderness.     Left Sinus: No maxillary sinus tenderness or frontal sinus tenderness.  Mouth/Throat:     Pharynx: Oropharynx is clear. Uvula midline. Postnasal drip present. No oropharyngeal exudate.     Tonsils: No tonsillar exudate or tonsillar abscesses.  Cardiovascular:     Rate and Rhythm: Normal rate and regular rhythm.     Pulses: Normal pulses.     Heart sounds: Normal heart sounds.  Neurological:     Mental Status: She is alert.     Assessment & Plan:   1. Class 3 obesity Patient is a pleasant 27 year old female patient who presents today to discuss her weight loss medication. She  has been tolerating 0.5mg  weekly and would like to increase to 1mg  weekly. She has lost 11lbs since starting this medication and is tolerating it well. Reports some increase in flatulence but reports she was having this issues prior to starting medication. Advised patient that with increasing the dose, we should start to notice more weight loss. Discussed to continue with healthy lifestyle modifications- including daily exercise and healthy diet.  - Semaglutide-Weight Management 1 MG/0.5ML SOAJ; Inject 1 mg into the skin once a week.  Dispense: 2 mL; Refill: 2  2. Prediabetes See #1 - Semaglutide-Weight Management 1 MG/0.5ML SOAJ; Inject 1 mg into the skin once a week.  Dispense: 2 mL; Refill: 2  3. Migraine without aura and without status migrainosus, not intractable Patient reports she went to UC on 08/29/2023 for a severe migraine that was ongoing for 5 days. They did place a referral to neurology for a formal diagnosis of migraines. She reports she has noticed an increase in severity of her migraines and does not have any relief with OTC medications. She reports that she often experiences migraines about once weekly that can last up to a few days. Discussed abortive treatment with patient. Will trial triptan. Counseled patient how to take medication, dose to take, and when to repeat dose. Counseled her that we can adjust dose if she consistently needs two tablets for migaine relief. Will follow-up at next visit.  - SUMAtriptan (IMITREX) 50 MG tablet; Take 1 tablet (50 mg total) by mouth every 2 (two) hours as needed for migraine. May repeat in 2 hours if headache persists or recurs.  Dispense: 9 tablet; Refill: 0  4. Gastroesophageal reflux disease, unspecified whether esophagitis present Patient reports an increase in burping and flatulence for "a while" now. She reports she had these symptoms prior to start [facility], but reports that it has increase in frequency and severity. She reports relief with  OTC PPI & H2 blocker. Patient would like to complete H. pylori testing- however, she took omeprazole on Sunday. Explained to patient that use of recent PPI may produce false negative results and she would like to complete at her follow-up appointment. Advised her to not take PPI for at least 2 weeks prior to H pylori breath testing. Will complete at next office visit.   5. Nasal sinus congestion Patient presents today with nasal congestion for the past 5 days. Denies shob, cough, fever/chills, ear pain. TM bulging without erythema, fluid or pus present. Physical exam remarkable for sinus congestion without frontal or maxillary sinus tenderness to palpation with exam. Advised patient to reach out if she does not notice an improvement by the end of the week- likely progressed into bacterial rhinosinusitis.    Return in about 4 weeks (around 09/29/2023) for weight loss .    Alyson Reedy, FNP

## 2023-09-01 NOTE — Patient Instructions (Addendum)
Call to schedule an appointment with [facility] at [facility] [address], [address] [place], [place] [address] [phone]

## 2023-09-01 NOTE — Progress Notes (Signed)
Complete physical exam  Patient: Christine Alexander   DOB: February 06, 1996   26 y.o. Female  MRN: [id number]  Subjective:    Chief Complaint  Patient presents with   Annual Exam   Weight Check    Feels good on medication, would like to increase   Christine Alexander is a 27 y.o. female who presents today for a complete physical exam. She reports consuming a general diet, does not eat fast food. Home exercise routine includes treadmill. She generally feels well. She reports sleeping well. She does have additional problems to discuss today (see add'nl note).   HEALTH SCREENINGS: - Vision Screening: up to date - Dental Visits: up to date - Pap smear: up to date - Breast Exam: not done today - STD Screening: Declined - Mammogram (40+): Not applicable  - Colonoscopy (45+): Not applicable  - Bone Density (65+ or under 65 with predisposing conditions): Not applicable  - Lung CA screening with low-dose CT:  Not applicable Adults age 80-80 who are current cigarette smokers or quit within the last 15 years. Must have 20 pack year history.   IMMUNIZATIONS: - Tdap: Tetanus vaccination status reviewed: last tetanus booster within 10 years. - HPV: Up to date - Influenza: Refused - Pneumovax: Not applicable - Prevnar 20: Not applicable - Zostavax (50+): Not applicable   Depression screenings:    06/18/2023    9:08 AM 10/08/2021   11:20 AM 10/04/2018    9:51 AM  Depression screen PHQ 2/9  Decreased Interest 1  0  Down, Depressed, Hopeless 1 0 0  PHQ - 2 Score 2 0 0  Altered sleeping 3 0   Tired, decreased energy 3 3   Change in appetite 1 3   Feeling bad or failure about yourself  3 3   Trouble concentrating  3   Moving slowly or fidgety/restless 2 0   Suicidal thoughts 0 0   PHQ-9 Score 14 12   Difficult doing work/chores Somewhat difficult      Anxiety screenings:    06/18/2023    9:08 AM 10/08/2021   11:20 AM 02/04/2018    2:11 PM  GAD 7 : Generalized Anxiety Score  Nervous, Anxious, on  Edge 3 3 3   Control/stop worrying 3 0 0  Worry too much - different things 3 3 3   Trouble relaxing 3 3 3   Restless 3 3 3   Easily annoyed or irritable 3 3 3   Afraid - awful might happen 3 3 3   Total GAD 7 Score 21 18 18   Anxiety Difficulty Somewhat difficult Somewhat difficult Somewhat difficult     Patient Care Team: Alyson Reedy, FNP as PCP - General (Family Medicine)   Outpatient Medications Prior to Visit  Medication Sig   acetaminophen (TYLENOL) 500 MG tablet Take 500 mg by mouth every 6 (six) hours as needed for mild pain.   meloxicam (MOBIC) 7.5 MG tablet Take 1 tablet (7.5 mg total) by mouth daily as needed.   norethindrone (MICRONOR) 0.35 MG tablet Take 1 tablet by mouth daily.   Prenatal Vit-Fe Fumarate-FA (PRENATAL MULTIVITAMIN) TABS tablet Take 1 tablet by mouth daily at 12 noon.   [DISCONTINUED] Semaglutide-Weight Management 0.5 MG/0.5ML SOAJ Inject 0.5 mg into the skin once a week.   No facility-administered medications prior to visit.     ROS: Denies fever, fatigue, unexplained weight loss/gain, chest pain, SHOB, lower extremity edema, and palpitations. Denies neurological deficits, genitourinary complaints, and skin changes. Reports sinus congestion, recent increase in migraines, &  excessive gas/epigastric pain.      Objective:    BP 116/80   Pulse 75   Ht 5\' 2"  (1.575 m)   Wt 219 lb 4.8 oz (99.5 kg)   LMP 08/20/2023 (Approximate) Comment: Irregular  SpO2 99%   Breastfeeding No   BMI 40.11 kg/m  BP Readings from Last 3 Encounters:  09/01/23 116/80  07/30/23 128/77  06/18/23 116/76    GENERAL APPEARANCE: Well-appearing, in NAD. Well nourished.  SKIN: Pink, warm and dry. Turgor normal. No rash, lesion, ulceration, or ecchymoses. Hair evenly distributed.  HEENT: HEAD: Normocephalic.  EYES: PERRLA. EOMI. Lids intact w/o defect. Sclera white, Conjunctiva pink w/o exudate.  EARS: External ear w/o redness, swelling, masses or lesions. EAC clear. TM's  intact, translucent w/ bulging, appropriate landmarks visualized. Sinus congestion present. Sinus tenderness not present with palpation. Appropriate acuity to conversational tones.  NOSE: Septum midline w/o deformity. Nares patent, mucosa pink and non-inflamed w/o drainage. No sinus tenderness.  THROAT: Uvula midline. Oropharynx clear. Tonsils non-inflamed w/o exudate. Oral mucosa pink and moist.  NECK: Supple, Trachea midline. Full ROM w/o pain or tenderness. No lymphadenopathy. Thyroid non-tender w/o enlargement or palpable masses.  BREASTS: Deferred today. RESPIRATORY: Chest wall symmetrical w/o masses. Respirations even and non-labored. Breath sounds clear to auscultation bilaterally. No wheezes, rales, rhonchi, or crackles. CARDIAC: S1, S2 present, regular rate and rhythm. No gallops, murmurs, rubs, or clicks. PMI w/o lifts, heaves, or thrills. No carotid bruits. Capillary refill <2 seconds. Peripheral pulses 2+ bilaterally. GI: Abdomen soft w/o distention. Normoactive bowel sounds. No palpable masses or tenderness. No guarding or rebound tenderness. Liver and spleen w/o tenderness or enlargement. No CVA tenderness.  GU: Deferred today.  MSK: Muscle tone and strength appropriate for age, w/o atrophy or abnormal movement.  EXTREMITIES: Active ROM intact, w/o tenderness, crepitus, or contracture. No obvious joint deformities or effusions. No clubbing, edema, or cyanosis.  NEUROLOGIC: CN's II-XII intact. Motor strength symmetrical with no obvious weakness. No sensory deficits. DTR's 2+ symmetric bilaterally. Steady, even gait.  PSYCH/MENTAL STATUS: Alert, oriented x 3. Cooperative, appropriate mood and affect.     Assessment & Plan:    Routine Health Maintenance and Physical Exam  Health Maintenance  Topic Date Due   COVID-19 Vaccine (3 - Mixed Product risk series) 09/17/2023*   Flu Shot  01/11/2024*   Pap Smear  05/02/2025   DTaP/Tdap/Td vaccine (10 - Td or Tdap) 09/08/2032   HPV Vaccine   Completed   Hepatitis C Screening  Completed   HIV Screening  Completed  *Topic was postponed. The date shown is not the original due date.   Wellness examination Labs reviewed/discussed today.  Review of PMH, FH, SH, medications and HM performed. Preventative care hand-out provided.  Recommend healthy diet.  Recommend approximately 150 minutes/week of moderate intensity exercise. Recommend regular dental and vision exams. Always use seatbelt/lap and shoulder restraints. Recommend using smoke alarms and checking batteries at least twice a year. Recommend using sunscreen when outside. Discussed immunization recommendations for influenza vaccine. Patient declines at this time, will consider.    Return in about 4 weeks (around 09/29/2023) for weight loss .     Alyson Reedy, FNP

## 2023-09-03 ENCOUNTER — Encounter (HOSPITAL_BASED_OUTPATIENT_CLINIC_OR_DEPARTMENT_OTHER): Payer: Self-pay | Admitting: Family Medicine

## 2023-09-03 ENCOUNTER — Other Ambulatory Visit (HOSPITAL_BASED_OUTPATIENT_CLINIC_OR_DEPARTMENT_OTHER): Payer: Self-pay

## 2023-09-06 ENCOUNTER — Other Ambulatory Visit (HOSPITAL_BASED_OUTPATIENT_CLINIC_OR_DEPARTMENT_OTHER): Payer: Self-pay

## 2023-09-06 ENCOUNTER — Other Ambulatory Visit: Payer: Self-pay

## 2023-09-08 DIAGNOSIS — N939 Abnormal uterine and vaginal bleeding, unspecified: Secondary | ICD-10-CM | POA: Diagnosis not present

## 2023-09-09 ENCOUNTER — Other Ambulatory Visit (HOSPITAL_BASED_OUTPATIENT_CLINIC_OR_DEPARTMENT_OTHER): Payer: Self-pay

## 2023-09-09 ENCOUNTER — Other Ambulatory Visit: Payer: Self-pay

## 2023-09-09 ENCOUNTER — Encounter (HOSPITAL_BASED_OUTPATIENT_CLINIC_OR_DEPARTMENT_OTHER): Payer: Self-pay

## 2023-09-29 ENCOUNTER — Other Ambulatory Visit (HOSPITAL_BASED_OUTPATIENT_CLINIC_OR_DEPARTMENT_OTHER): Payer: Self-pay

## 2023-09-29 ENCOUNTER — Ambulatory Visit (INDEPENDENT_AMBULATORY_CARE_PROVIDER_SITE_OTHER): Payer: Medicaid Other | Admitting: Family Medicine

## 2023-09-29 ENCOUNTER — Encounter (HOSPITAL_BASED_OUTPATIENT_CLINIC_OR_DEPARTMENT_OTHER): Payer: Self-pay | Admitting: Family Medicine

## 2023-09-29 DIAGNOSIS — R7303 Prediabetes: Secondary | ICD-10-CM

## 2023-09-29 DIAGNOSIS — E66813 Obesity, class 3: Secondary | ICD-10-CM

## 2023-09-29 MED ORDER — SEMAGLUTIDE-WEIGHT MANAGEMENT 1 MG/0.5ML ~~LOC~~ SOAJ
1.0000 mg | SUBCUTANEOUS | 1 refills | Status: DC
  Filled 2023-09-29 – 2023-10-01 (×2): qty 2, 28d supply, fill #0

## 2023-09-29 NOTE — Progress Notes (Signed)
   Established Patient Office Visit  Subjective  Patient ID: Christine Alexander, female    DOB: 08/05/1996  Age: 27 y.o. MRN: [id number]  Chief Complaint  Patient presents with   Weight Check    Wegovy F/U, would like to increase dose   Christine Alexander is a 27 year old female patient who presents today for weight loss medication follow-up. At her initial visit, she was 230lbs and today she weighed 212lbs. Denies adverse side effects and would like to increase dose today.   She reports her acid reflux has improved- she figured out that she needed to eat small meals throughout the day and avoid certain foods.  No dairy at night  She is eating smaller snacks throughout the day and is noticing less acid reflux   Review of Systems  Constitutional:  Negative for malaise/fatigue.  Eyes:  Negative for blurred vision and double vision.  Respiratory:  Negative for cough and shortness of breath.   Cardiovascular:  Negative for chest pain, palpitations and leg swelling.  Gastrointestinal:  Negative for abdominal pain, constipation, diarrhea, heartburn, nausea and vomiting.  Genitourinary:  Negative for frequency and urgency.  Neurological:  Negative for dizziness and headaches.  Psychiatric/Behavioral:  Negative for depression. The patient is not nervous/anxious.     Objective:    BP 125/85   Pulse 80   Ht 5\' 2"  (1.575 m)   Wt 212 lb 8 oz (96.4 kg)   LMP 09/18/2023 (Exact Date) Comment: Irregular  SpO2 99%   BMI 38.87 kg/m  BP Readings from Last 3 Encounters:  09/29/23 125/85  09/01/23 116/80  07/30/23 128/77    Physical Exam Vitals reviewed.  Constitutional:      Appearance: Normal appearance.  Cardiovascular:     Rate and Rhythm: Normal rate and regular rhythm.     Pulses: Normal pulses.     Heart sounds: Normal heart sounds.  Pulmonary:     Effort: Pulmonary effort is normal.     Breath sounds: Normal breath sounds.  Neurological:     Mental Status: She is alert.  Psychiatric:         Mood and Affect: Mood normal.        Behavior: Behavior normal.     Assessment & Plan:   1. Class 3 obesity Patient presents for weight loss follow-up and would like to increase Wegovy from 0.5mg  weekly to 1mg  weekly. Denies adverse side effects with mediation. She has lost 18lbs since starting this medication. Refill sent to pharmacy on file. Discussed optimal goal of returning BMI to the healthy weight range of 24-25. Patient reports prior to her pregnancies, she was about 140 lbs and would like to get back down to this weight. Advised patient to continue with healthy lifestyle modifications and increase daily activity 2-3 days per week for 20-30 minutes.  - Semaglutide-Weight Management 1 MG/0.5ML SOAJ; Inject 1 mg into the skin once a week.  Dispense: 2 mL; Refill: 1  2. Prediabetes See #1 - Semaglutide-Weight Management 1 MG/0.5ML SOAJ; Inject 1 mg into the skin once a week.  Dispense: 2 mL; Refill: 1   Return in about 4 weeks (around 10/27/2023) for weight loss med f/u .    Christine Reedy, FNP

## 2023-10-01 ENCOUNTER — Encounter (HOSPITAL_BASED_OUTPATIENT_CLINIC_OR_DEPARTMENT_OTHER): Payer: Self-pay | Admitting: Pharmacist

## 2023-10-01 ENCOUNTER — Encounter (HOSPITAL_BASED_OUTPATIENT_CLINIC_OR_DEPARTMENT_OTHER): Payer: Self-pay | Admitting: Family Medicine

## 2023-10-01 ENCOUNTER — Other Ambulatory Visit (HOSPITAL_BASED_OUTPATIENT_CLINIC_OR_DEPARTMENT_OTHER): Payer: Self-pay

## 2023-10-01 MED ORDER — WEGOVY 1.7 MG/0.75ML ~~LOC~~ SOAJ
1.7000 mg | SUBCUTANEOUS | 0 refills | Status: DC
  Filled 2023-10-01 (×2): qty 3, 28d supply, fill #0

## 2023-10-20 DIAGNOSIS — N939 Abnormal uterine and vaginal bleeding, unspecified: Secondary | ICD-10-CM | POA: Diagnosis not present

## 2023-10-27 ENCOUNTER — Encounter (HOSPITAL_BASED_OUTPATIENT_CLINIC_OR_DEPARTMENT_OTHER): Payer: Self-pay | Admitting: Family Medicine

## 2023-10-27 ENCOUNTER — Ambulatory Visit (HOSPITAL_BASED_OUTPATIENT_CLINIC_OR_DEPARTMENT_OTHER): Payer: Medicaid Other | Admitting: Family Medicine

## 2023-10-27 ENCOUNTER — Other Ambulatory Visit (HOSPITAL_BASED_OUTPATIENT_CLINIC_OR_DEPARTMENT_OTHER): Payer: Self-pay

## 2023-10-27 VITALS — BP 116/74 | HR 80 | Ht 62.0 in | Wt 207.8 lb

## 2023-10-27 DIAGNOSIS — R7989 Other specified abnormal findings of blood chemistry: Secondary | ICD-10-CM | POA: Insufficient documentation

## 2023-10-27 DIAGNOSIS — E669 Obesity, unspecified: Secondary | ICD-10-CM | POA: Insufficient documentation

## 2023-10-27 DIAGNOSIS — R7303 Prediabetes: Secondary | ICD-10-CM | POA: Insufficient documentation

## 2023-10-27 DIAGNOSIS — Z23 Encounter for immunization: Secondary | ICD-10-CM | POA: Diagnosis not present

## 2023-10-27 LAB — POCT GLYCOSYLATED HEMOGLOBIN (HGB A1C)
HbA1c POC (<> result, manual entry): 5.3 % (ref 4.0–5.6)
HbA1c, POC (prediabetic range): 5.3 % — AB (ref 5.7–6.4)
Hemoglobin A1C: 5.3 % (ref 4.0–5.6)

## 2023-10-27 MED ORDER — WEGOVY 1.7 MG/0.75ML ~~LOC~~ SOAJ
1.7000 mg | SUBCUTANEOUS | 3 refills | Status: DC
  Filled 2023-10-27: qty 3, 28d supply, fill #0
  Filled 2023-11-21: qty 3, 28d supply, fill #1
  Filled 2023-12-10 – 2024-01-19 (×5): qty 3, 28d supply, fill #2

## 2023-10-27 NOTE — Progress Notes (Signed)
 Subjective:   Christine Alexander 08-01-1996 10/27/2023  Chief Complaint  Patient presents with   Medical Management of Chronic Issues    4-week follow up; pt states that she feels like her weight is stuck where it currently is and she is not losing anymore.    HPI: Christine Alexander presents today for re-assessment and management of chronic medical conditions.  WEIGHT MANAGEMENT: Christine Alexander presents for weight management.  Adhering to healthy diet: Wegovy  1.7mg  weekly  Current dietary plan: Low carb, low sugar (similar to keto)  Regular exercise regimen: Walking, joined Fitness program   Side Effects: Belching, and bloating. She does use probiotic and apple cider vinegar.   Patient is concerned about recent elevation in ALT level. She has made dietary changes with regular exercise to improve. She is agreeable to having labs checked today.   Lab Results  Component Value Date   ALT 60 (H) 07/30/2023   AST 34 07/30/2023   ALKPHOS 105 07/30/2023   BILITOT 0.7 07/30/2023    Wt Readings from Last 3 Encounters:  10/27/23 207 lb 12.8 oz (94.3 kg)  09/29/23 212 lb 8 oz (96.4 kg)  09/01/23 219 lb 4.8 oz (99.5 kg)   IMPAIRED FASTING GLUCOSE Christine Alexander is here for medical management of impaired fasting glucose.  Patient's current IFG medication regimen is: None (using wegovy  for weight loss)  Adhering to a diabetic diet: yes Exercising Regularly: Yes Checking Blood Sugars: N/a  Denies polydipsia, polyphagia, polyuria.   Lab Results  Component Value Date   HGBA1C 5.3 10/27/2023   HGBA1C 5.3 10/27/2023   HGBA1C 5.3 (A) 10/27/2023    The following portions of the patient's history were reviewed and updated as appropriate: past medical history, past surgical history, family history, social history, allergies, medications, and problem list.   Patient Active Problem List   Diagnosis Date Noted   Class 3 obesity 07/30/2023   Wellness examination 07/30/2023   Hirsutism 10/08/2021    Polycystic ovaries 10/08/2021   Depression, recurrent (HCC) 10/08/2021   Cholestasis 03/07/2020   Chronic idiopathic constipation 01/12/2019   Past Medical History:  Diagnosis Date   Cholestasis during pregnancy    Constipation    Medical history non-contributory    Polycystic ovaries    Past Surgical History:  Procedure Laterality Date   CHOLECYSTECTOMY  2015   Family History  Problem Relation Age of Onset   Hypertension Father    Outpatient Medications Prior to Visit  Medication Sig Dispense Refill   acetaminophen  (TYLENOL ) 500 MG tablet Take 500 mg by mouth every 6 (six) hours as needed for mild pain.     meloxicam  (MOBIC ) 7.5 MG tablet Take 1 tablet (7.5 mg total) by mouth daily as needed. 30 tablet 2   norethindrone (MICRONOR) 0.35 MG tablet Take 1 tablet by mouth daily.     Prenatal Vit-Fe Fumarate-FA (PRENATAL MULTIVITAMIN) TABS tablet Take 1 tablet by mouth daily at 12 noon.     SUMAtriptan  (IMITREX ) 50 MG tablet Take 1 tablet (50 mg total) by mouth every 2 (two) hours as needed for migraine. May repeat in 2 hours if headache persists or recurs. 9 tablet 0   Semaglutide -Weight Management (WEGOVY ) 1.7 MG/0.75ML SOAJ Inject 1.7 mg into the skin once a week. 3 mL 0   No facility-administered medications prior to visit.   Allergies  Allergen Reactions   Morphine Itching   Morphine And Codeine Itching     ROS: A complete ROS was performed with pertinent  positives/negatives noted in the HPI. The remainder of the ROS are negative.    Objective:   Today's Vitals   10/27/23 0854  BP: 116/74  Pulse: 80  SpO2: 99%  Weight: 207 lb 12.8 oz (94.3 kg)  Height: 5' 2 (1.575 m)    Physical Exam          GENERAL: Well-appearing, in NAD. Well nourished.  SKIN: Pink, warm and dry. No rash, lesion, ulceration, or ecchymoses.  Head: Normocephalic. NECK: Trachea midline. Full ROM w/o pain or tenderness.  RESPIRATORY: Chest wall symmetrical. Respirations even and  non-labored. Breath sounds clear to auscultation bilaterally.  CARDIAC: S1, S2 present, regular rate and rhythm without murmur or gallops. Peripheral pulses 2+ bilaterally.  MSK: Muscle tone and strength appropriate for age.  NEUROLOGIC: No motor or sensory deficits. Steady, even gait. C2-C12 intact.  PSYCH/MENTAL STATUS: Alert, oriented x 3. Cooperative, appropriate mood and affect.   There are no preventive care reminders to display for this patient.  Results for orders placed or performed in visit on 10/27/23  POCT glycosylated hemoglobin (Hb A1C)  Result Value Ref Range   Hemoglobin A1C 5.3 4.0 - 5.6 %   HbA1c POC (<> result, manual entry) 5.3 4.0 - 5.6 %   HbA1c, POC (prediabetic range) 5.3 (A) 5.7 - 6.4 %   HbA1c, POC (controlled diabetic range)        Assessment & Plan:   1. Prediabetes (Primary) A1C improved from 5.7 to 5.3. Pt will continue with diet, exercise and use of Wegovy  for weight management.  - POCT glycosylated hemoglobin (Hb A1C)  2. Elevated LFTs Likely fatty liver due to diet and obesity previously. Will recheck LFTs today with labwork. Discussed good diet, exercise changes with patient.  - Hepatic function panel  3. Obesity (BMI 30-39.9) Weight improved. BMI also improving. Continue Wegovy  1.7 mg weekly and recheck in 6 months. Pt will reach out to PCP if wanting to increase dosage if tolerating well. Discussed continuing good diet, exercise changes and lifestyle habits.   4. Immunization due - Flu vaccine trivalent PF, 6mos and older(Flulaval,Afluria,Fluarix,Fluzone)  Meds ordered this encounter  Medications   Semaglutide -Weight Management (WEGOVY ) 1.7 MG/0.75ML SOAJ    Sig: Inject 1.7 mg into the skin once a week.    Dispense:  3 mL    Refill:  3    Supervising Provider:   DE CUBA, RAYMOND J [[id number]]   Lab Orders         Hepatic function panel         POCT glycosylated hemoglobin (Hb A1C)     Return in about 6 months (around 04/25/2024) for Follow  up Weight and Prediabetes .    Patient to reach out to office if new, worrisome, or unresolved symptoms arise or if no improvement in patient's condition. Patient verbalized understanding and is agreeable to treatment plan. All questions answered to patient's satisfaction.    Christine Alexander, [place]

## 2023-10-27 NOTE — Patient Instructions (Signed)
 Try Omeprazole 40mg  daily before breakfast (30 min before) to help with acid reflux.

## 2023-10-28 LAB — HEPATIC FUNCTION PANEL
ALT: 32 [IU]/L (ref 0–32)
AST: 18 [IU]/L (ref 0–40)
Albumin: 4.6 g/dL (ref 4.0–5.0)
Alkaline Phosphatase: 108 [IU]/L (ref 44–121)
Bilirubin Total: 0.9 mg/dL (ref 0.0–1.2)
Bilirubin, Direct: 0.24 mg/dL (ref 0.00–0.40)
Total Protein: 7.1 g/dL (ref 6.0–8.5)

## 2023-10-28 NOTE — Progress Notes (Signed)
 Hi Arpita,  Your liver function panel came back and is normal and improved from 3 months ago.  As mentioned during your visit, this was likely due to dietary changes, decreasing fat and carbohydrates.  Please keep up the good work.

## 2023-12-03 DIAGNOSIS — Z20822 Contact with and (suspected) exposure to covid-19: Secondary | ICD-10-CM | POA: Diagnosis not present

## 2023-12-03 DIAGNOSIS — J069 Acute upper respiratory infection, unspecified: Secondary | ICD-10-CM | POA: Diagnosis not present

## 2023-12-03 DIAGNOSIS — R0602 Shortness of breath: Secondary | ICD-10-CM | POA: Diagnosis not present

## 2023-12-03 DIAGNOSIS — R051 Acute cough: Secondary | ICD-10-CM | POA: Diagnosis not present

## 2023-12-06 DIAGNOSIS — R0600 Dyspnea, unspecified: Secondary | ICD-10-CM | POA: Diagnosis not present

## 2023-12-09 DIAGNOSIS — Z20822 Contact with and (suspected) exposure to covid-19: Secondary | ICD-10-CM | POA: Diagnosis not present

## 2023-12-09 DIAGNOSIS — W25XXXA Contact with sharp glass, initial encounter: Secondary | ICD-10-CM | POA: Diagnosis not present

## 2023-12-09 DIAGNOSIS — R051 Acute cough: Secondary | ICD-10-CM | POA: Diagnosis not present

## 2023-12-09 DIAGNOSIS — S91332A Puncture wound without foreign body, left foot, initial encounter: Secondary | ICD-10-CM | POA: Diagnosis not present

## 2023-12-10 ENCOUNTER — Other Ambulatory Visit (HOSPITAL_BASED_OUTPATIENT_CLINIC_OR_DEPARTMENT_OTHER): Payer: Self-pay

## 2023-12-31 ENCOUNTER — Other Ambulatory Visit (HOSPITAL_BASED_OUTPATIENT_CLINIC_OR_DEPARTMENT_OTHER): Payer: Self-pay

## 2024-01-02 ENCOUNTER — Other Ambulatory Visit (HOSPITAL_BASED_OUTPATIENT_CLINIC_OR_DEPARTMENT_OTHER): Payer: Self-pay

## 2024-01-05 ENCOUNTER — Encounter (HOSPITAL_BASED_OUTPATIENT_CLINIC_OR_DEPARTMENT_OTHER): Payer: Self-pay | Admitting: *Deleted

## 2024-01-06 ENCOUNTER — Encounter (HOSPITAL_BASED_OUTPATIENT_CLINIC_OR_DEPARTMENT_OTHER): Payer: Self-pay

## 2024-01-07 ENCOUNTER — Other Ambulatory Visit (HOSPITAL_BASED_OUTPATIENT_CLINIC_OR_DEPARTMENT_OTHER): Payer: Self-pay

## 2024-01-08 ENCOUNTER — Other Ambulatory Visit (HOSPITAL_BASED_OUTPATIENT_CLINIC_OR_DEPARTMENT_OTHER): Payer: Self-pay

## 2024-01-11 ENCOUNTER — Encounter (HOSPITAL_BASED_OUTPATIENT_CLINIC_OR_DEPARTMENT_OTHER): Payer: Self-pay | Admitting: Family Medicine

## 2024-01-11 ENCOUNTER — Telehealth (HOSPITAL_BASED_OUTPATIENT_CLINIC_OR_DEPARTMENT_OTHER): Payer: Self-pay | Admitting: *Deleted

## 2024-01-11 NOTE — Telephone Encounter (Signed)
 Pt is scheduled for a visit 4/9.

## 2024-01-11 NOTE — Telephone Encounter (Signed)
 Copied from CRM [id number]. Topic: General - Other >> Jan 08, 2024 12:40 PM Priscille Loveless wrote: Reason for CRM: Mia, [employer] called and wanted to know pts weight and the last time it was taken. Mia stated that once the patient comes back in with an updated weight they will try to authorize again for weight loss medication. Once pt comes back in resubmit for prior authorization,

## 2024-01-13 ENCOUNTER — Other Ambulatory Visit (HOSPITAL_BASED_OUTPATIENT_CLINIC_OR_DEPARTMENT_OTHER): Payer: Self-pay

## 2024-01-16 ENCOUNTER — Other Ambulatory Visit (HOSPITAL_BASED_OUTPATIENT_CLINIC_OR_DEPARTMENT_OTHER): Payer: Self-pay

## 2024-01-18 ENCOUNTER — Ambulatory Visit (HOSPITAL_BASED_OUTPATIENT_CLINIC_OR_DEPARTMENT_OTHER): Payer: Medicaid Other | Admitting: Family Medicine

## 2024-01-19 ENCOUNTER — Other Ambulatory Visit (HOSPITAL_BASED_OUTPATIENT_CLINIC_OR_DEPARTMENT_OTHER): Payer: Self-pay

## 2024-01-20 ENCOUNTER — Telehealth (HOSPITAL_BASED_OUTPATIENT_CLINIC_OR_DEPARTMENT_OTHER): Admitting: Family Medicine

## 2024-01-20 ENCOUNTER — Other Ambulatory Visit (HOSPITAL_BASED_OUTPATIENT_CLINIC_OR_DEPARTMENT_OTHER): Payer: Self-pay

## 2024-01-20 ENCOUNTER — Encounter (HOSPITAL_BASED_OUTPATIENT_CLINIC_OR_DEPARTMENT_OTHER): Payer: Self-pay | Admitting: Family Medicine

## 2024-01-20 ENCOUNTER — Telehealth (HOSPITAL_BASED_OUTPATIENT_CLINIC_OR_DEPARTMENT_OTHER): Payer: Self-pay | Admitting: *Deleted

## 2024-01-20 VITALS — Ht 62.0 in | Wt 197.0 lb

## 2024-01-20 DIAGNOSIS — R7303 Prediabetes: Secondary | ICD-10-CM | POA: Diagnosis not present

## 2024-01-20 DIAGNOSIS — E669 Obesity, unspecified: Secondary | ICD-10-CM | POA: Diagnosis not present

## 2024-01-20 MED ORDER — WEGOVY 0.5 MG/0.5ML ~~LOC~~ SOAJ
0.5000 mg | SUBCUTANEOUS | 2 refills | Status: DC
  Filled 2024-01-20 – 2024-02-17 (×2): qty 2, 28d supply, fill #0

## 2024-01-20 MED ORDER — WEGOVY 0.25 MG/0.5ML ~~LOC~~ SOAJ
0.2500 mg | SUBCUTANEOUS | 0 refills | Status: DC
  Filled 2024-01-20: qty 2, 28d supply, fill #0

## 2024-01-20 NOTE — Progress Notes (Signed)
 Virtual Video Visit  I connected with Christine Alexander on 01/20/24 at  1:30 PM EDT by a video enabled telemedicine application and verified that I am speaking with the correct person using two identifiers.   Location patient: Home Location provider: Clinic Persons participating in the virtual visit: Patient; Cristy Hilts CMA; Jerre Simon, FNP-C  I discussed the limitations of evaluation and management by telemedicine and the availability of in-person appointments. The patient expressed understanding and agreed to proceed.  Chief Complaint  Patient presents with   Medication Management    Patient is having a follow up visit for her [facility]. Pt has missed a couple doses of her Wegovy due to prior auth being denied as it had been more than 45 days since pt was last seen. Pt's weight has been documented. Pt also stated that she thought her dose was going to be increased after this visit.    SUBJECTIVE:  HPI:  WEIGHT MANAGEMENT: Christine Alexander presents for weight management. Patient was previously on Wegovy 1.7mg  weekly and was doing well. She reports she was unable to fill her prescription due to insurance although the medication was approved and had 3 refills present. She states it has been approx. 3 weeks since her last dosage and she would like to restart her medication. Patient last dosage of Wegovy 1.7 mg was on 01/01/2024.   Adhering to healthy diet: Keto diet- low carb, low sugar diet Regular exercise regimen: Walking daily   Medications tried in the past: None  Her initial weight prior to starting medication for weight loss was 230 lb 12.8 oz (104.7 kg) as of 06/18/2023.   She has ongoing comorbidity of elevated LFT and prediabetes. Her Last A1C was down to 5.3 in January 2025.   Wt Readings from Last 3 Encounters:  01/20/24 197 lb (89.4 kg)  10/27/23 207 lb 12.8 oz (94.3 kg)  09/29/23 212 lb 8 oz (96.4 kg)      The following portions of the patient's history were reviewed and  updated as appropriate: medical history, surgical history, medications, allergies, social history, and family history.    Past Medical History:  Diagnosis Date   Cholestasis during pregnancy    Constipation    Medical history non-contributory    Polycystic ovaries    Past Surgical History:  Procedure Laterality Date   CHOLECYSTECTOMY  2015     Current Outpatient Medications:    acetaminophen (TYLENOL) 500 MG tablet, Take 500 mg by mouth every 6 (six) hours as needed for mild pain., Disp: , Rfl:    meloxicam (MOBIC) 7.5 MG tablet, Take 1 tablet (7.5 mg total) by mouth daily as needed., Disp: 30 tablet, Rfl: 2   norethindrone (MICRONOR) 0.35 MG tablet, Take 1 tablet by mouth daily., Disp: , Rfl:    Prenatal Vit-Fe Fumarate-FA (PRENATAL MULTIVITAMIN) TABS tablet, Take 1 tablet by mouth daily at 12 noon., Disp: , Rfl:    [START ON 02/15/2024] Semaglutide-Weight Management (WEGOVY) 0.5 MG/0.5ML SOAJ, Inject 0.5 mg into the skin once a week., Disp: 2 mL, Rfl: 2   SUMAtriptan (IMITREX) 50 MG tablet, Take 1 tablet (50 mg total) by mouth every 2 (two) hours as needed for migraine. May repeat in 2 hours if headache persists or recurs., Disp: 9 tablet, Rfl: 0   WEGOVY 0.25 MG/0.5ML SOAJ, Inject 0.25 mg into the skin once a week. Use this dose for 1 month (4 shots) and then increase to next higher dose., Disp: 2 mL, Rfl: 0 Allergies  Allergen Reactions   Morphine Itching   Morphine And Codeine Itching    Social History   Socioeconomic History   Marital status: Married    Spouse name: Amir   Number of children: 2   Years of education: Not on file   Highest education level: Not on file  Occupational History   Not on file  Tobacco Use   Smoking status: Never   Smokeless tobacco: Never  Vaping Use   Vaping status: Never Used  Substance and Sexual Activity   Alcohol use: No   Drug use: No   Sexual activity: Yes    Birth control/protection: None  Other Topics Concern   Not on file   Social History Narrative   Not on file   Social Drivers of Health   Financial Resource Strain: Low Risk  (10/27/2023)   Overall Financial Resource Strain (CARDIA)    Difficulty of Paying Living Expenses: Not hard at all  Food Insecurity: No Food Insecurity (11/24/2022)   Hunger Vital Sign    Worried About Running Out of Food in the Last Year: Never true    Ran Out of Food in the Last Year: Never true  Transportation Needs: No Transportation Needs (11/24/2022)   PRAPARE - Administrator, Civil Service (Medical): No    Lack of Transportation (Non-Medical): No  Physical Activity: Inactive (10/27/2023)   Exercise Vital Sign    Days of Exercise per Week: 0 days    Minutes of Exercise per Session: 0 min  Stress: No Stress Concern Present (10/27/2023)   [employer] of Occupational Health - Occupational Stress Questionnaire    Feeling of Stress : Not at all  Social Connections: Moderately Integrated (10/27/2023)   Social Connection and Isolation Panel [NHANES]    Frequency of Communication with Friends and Family: More than three times a week    Frequency of Social Gatherings with Friends and Family: More than three times a week    Attends Religious Services: More than 4 times per year    Active Member of [employer] or Organizations: No    Attends Banker Meetings: Never    Marital Status: Married  Catering manager Violence: Not At Risk (11/24/2022)   Humiliation, Afraid, Rape, and Kick questionnaire    Fear of Current or Ex-Partner: No    Emotionally Abused: No    Physically Abused: No    Sexually Abused: No    Family History  Problem Relation Age of Onset   Hypertension Father      ROS: A complete ROS was performed with pertinent positives/negatives noted in the HPI. The remainder of the ROS are negative.    OBJECTIVE:  VITALS per patient if applicable: Today's Vitals   01/20/24 1321  Weight: 197 lb (89.4 kg)  Height: 5\' 2"  (1.575 m)   Body mass  index is 36.03 kg/m.   GENERAL: Alert and oriented. Appears well and in no acute distress. HEENT: Atraumatic. Conjunctiva clear. No obvious abnormalities on inspection of external nose and ears. NECK: Normal movements of the head and neck. LUNGS: On inspection, no signs of respiratory distress. Breathing rate appears normal. No obvious gross SOB, gasping or wheezing, and no conversational dyspnea. CV: No obvious cyanosis. MS: Moves all visible extremities without noticeable abnormality. PSYCH/NEURO: Pleasant and cooperative. No obvious depression or anxiety. Speech and thought processing grossly intact.  ASSESSMENT AND PLAN: 1. Obesity (BMI 30-39.9) (Primary) Discussed risks of restarting dosage after missing her dosage for 3 weeks with  risk of pancreatitis, GI upset, small bowel obstruction, etc. And patient is agreeable to restart her Wegovy at 0.25mg  weekly x 4 week and titrate as tolerated. Side effects and adverse effects of medication reviewed and patient verbalized understanding. She is doing well with diet and exercise.   - WEGOVY 0.25 MG/0.5ML SOAJ; Inject 0.25 mg into the skin once a week. Use this dose for 1 month (4 shots) and then increase to next higher dose.  Dispense: 2 mL; Refill: 0 - Semaglutide-Weight Management (WEGOVY) 0.5 MG/0.5ML SOAJ; Inject 0.5 mg into the skin once a week.  Dispense: 2 mL; Refill: 2  2. Prediabetes A1C 5.3 in January 2025 with benefit of GLP 1. Will restart Wegovy for obesity related comorbidity and repeat in 3 months at appt. Doing well with low carb, low sugar diet at this time.   - WEGOVY 0.25 MG/0.5ML SOAJ; Inject 0.25 mg into the skin once a week. Use this dose for 1 month (4 shots) and then increase to next higher dose.  Dispense: 2 mL; Refill: 0 - Semaglutide-Weight Management (WEGOVY) 0.5 MG/0.5ML SOAJ; Inject 0.5 mg into the skin once a week.  Dispense: 2 mL; Refill: 2    I discussed the assessment and treatment plan with the patient. The  patient was provided an opportunity to ask questions and all were answered. The patient agreed with the plan and demonstrated an understanding of the instructions.   The patient was advised to call back or seek an in-person evaluation if the symptoms worsen or if the condition fails to improve as anticipated.  I provided 6 minutes of non-face-to-face time during this encounter.  Return in about 4 weeks (around 02/17/2024) for Weight Check UP in office .  Hilbert Bible, [place]

## 2024-01-20 NOTE — Telephone Encounter (Signed)
 Please move pt appt up if available

## 2024-01-20 NOTE — Telephone Encounter (Signed)
 Copied from CRM [id number]. Topic: Appointments - Scheduling Inquiry for Clinic >> Jan 20, 2024  3:29 PM Fuller Mandril wrote: Reason for CRM: Patient called. Had virtual appt with alexis today and wanted to schedule follow up. States she wanted to see her back in 4 weeks. Schedule for 1st available 5/29 and added to waitlist. Prefers morning appts but will be open to any sooner openings. She would like someone to check with provider and make sure this is ok and that she will be able to get next prescription for medication on time. States she is just having to restart it due to a missed appt. Thank You

## 2024-01-25 ENCOUNTER — Other Ambulatory Visit (HOSPITAL_BASED_OUTPATIENT_CLINIC_OR_DEPARTMENT_OTHER): Payer: Self-pay

## 2024-01-25 ENCOUNTER — Encounter (HOSPITAL_BASED_OUTPATIENT_CLINIC_OR_DEPARTMENT_OTHER): Payer: Self-pay

## 2024-02-17 ENCOUNTER — Other Ambulatory Visit (HOSPITAL_BASED_OUTPATIENT_CLINIC_OR_DEPARTMENT_OTHER): Payer: Self-pay

## 2024-02-17 ENCOUNTER — Encounter (HOSPITAL_BASED_OUTPATIENT_CLINIC_OR_DEPARTMENT_OTHER): Payer: Self-pay

## 2024-02-18 ENCOUNTER — Other Ambulatory Visit (HOSPITAL_BASED_OUTPATIENT_CLINIC_OR_DEPARTMENT_OTHER): Payer: Self-pay

## 2024-02-19 ENCOUNTER — Other Ambulatory Visit (HOSPITAL_BASED_OUTPATIENT_CLINIC_OR_DEPARTMENT_OTHER): Payer: Self-pay

## 2024-02-22 ENCOUNTER — Other Ambulatory Visit (HOSPITAL_BASED_OUTPATIENT_CLINIC_OR_DEPARTMENT_OTHER): Payer: Self-pay | Admitting: Family Medicine

## 2024-02-22 ENCOUNTER — Other Ambulatory Visit (HOSPITAL_BASED_OUTPATIENT_CLINIC_OR_DEPARTMENT_OTHER): Payer: Self-pay

## 2024-02-22 DIAGNOSIS — E669 Obesity, unspecified: Secondary | ICD-10-CM

## 2024-02-22 DIAGNOSIS — R7303 Prediabetes: Secondary | ICD-10-CM

## 2024-02-27 ENCOUNTER — Other Ambulatory Visit (HOSPITAL_BASED_OUTPATIENT_CLINIC_OR_DEPARTMENT_OTHER): Payer: Self-pay

## 2024-02-27 ENCOUNTER — Other Ambulatory Visit (HOSPITAL_BASED_OUTPATIENT_CLINIC_OR_DEPARTMENT_OTHER): Payer: Self-pay | Admitting: Family Medicine

## 2024-02-27 DIAGNOSIS — R7303 Prediabetes: Secondary | ICD-10-CM

## 2024-02-27 DIAGNOSIS — E669 Obesity, unspecified: Secondary | ICD-10-CM

## 2024-02-29 ENCOUNTER — Other Ambulatory Visit (HOSPITAL_BASED_OUTPATIENT_CLINIC_OR_DEPARTMENT_OTHER): Payer: Self-pay

## 2024-02-29 MED ORDER — WEGOVY 0.25 MG/0.5ML ~~LOC~~ SOAJ
0.2500 mg | SUBCUTANEOUS | 0 refills | Status: DC
  Filled 2024-02-29: qty 2, 28d supply, fill #0

## 2024-03-02 ENCOUNTER — Other Ambulatory Visit (HOSPITAL_BASED_OUTPATIENT_CLINIC_OR_DEPARTMENT_OTHER): Payer: Self-pay

## 2024-03-10 ENCOUNTER — Other Ambulatory Visit (HOSPITAL_BASED_OUTPATIENT_CLINIC_OR_DEPARTMENT_OTHER): Payer: Self-pay

## 2024-03-10 ENCOUNTER — Encounter (HOSPITAL_BASED_OUTPATIENT_CLINIC_OR_DEPARTMENT_OTHER): Payer: Self-pay | Admitting: Family Medicine

## 2024-03-10 ENCOUNTER — Ambulatory Visit (HOSPITAL_BASED_OUTPATIENT_CLINIC_OR_DEPARTMENT_OTHER): Admitting: Family Medicine

## 2024-03-10 VITALS — BP 121/76 | HR 70 | Ht 62.0 in | Wt 207.0 lb

## 2024-03-10 DIAGNOSIS — R7303 Prediabetes: Secondary | ICD-10-CM

## 2024-03-10 DIAGNOSIS — E669 Obesity, unspecified: Secondary | ICD-10-CM | POA: Diagnosis not present

## 2024-03-10 LAB — HEMOGLOBIN A1C
Est. average glucose Bld gHb Est-mCnc: 111 mg/dL
Hgb A1c MFr Bld: 5.5 % (ref 4.8–5.6)

## 2024-03-10 MED ORDER — SEMAGLUTIDE-WEIGHT MANAGEMENT 0.25 MG/0.5ML ~~LOC~~ SOAJ
0.2500 mg | SUBCUTANEOUS | 0 refills | Status: DC
  Filled 2024-03-10: qty 2, 28d supply, fill #0

## 2024-03-10 NOTE — Progress Notes (Signed)
 Subjective:   Christine Alexander 11/10/1995 03/10/2024  Chief Complaint  Patient presents with   Medical Management of Chronic Issues    Patient is here today for med management as she has been having problems getting her Wegovy . Patient has been without Wegovy  for about 2 weeks.    HPI: Christine Alexander presents today for re-assessment and management of chronic medical conditions.   WEIGHT MANAGEMENT: Christine Alexander presents for weight management. Patient states she has been denied her Wegovy  in May due to loss of Medicaid.  Adhering to healthy diet: Yes, low carb, low sugar Regular exercise regimen: Walking frequently   Medications tried in the past: Wegovy   Wt Readings from Last 3 Encounters:  03/10/24 207 lb (93.9 kg)  01/20/24 197 lb (89.4 kg)  10/27/23 207 lb 12.8 oz (94.3 kg)     The following portions of the patient's history were reviewed and updated as appropriate: past medical history, past surgical history, family history, social history, allergies, medications, and problem list.   Patient Active Problem List   Diagnosis Date Noted   Obesity (BMI 30-39.9) 10/27/2023   Elevated LFTs 10/27/2023   Prediabetes 10/27/2023   Class 3 obesity 07/30/2023   Wellness examination 07/30/2023   Hirsutism 10/08/2021   Polycystic ovaries 10/08/2021   Depression, recurrent (HCC) 10/08/2021   Cholestasis 03/07/2020   Chronic idiopathic constipation 01/12/2019   Past Medical History:  Diagnosis Date   Cholestasis during pregnancy    Constipation    Medical history non-contributory    Polycystic ovaries    Past Surgical History:  Procedure Laterality Date   CHOLECYSTECTOMY  2015   Family History  Problem Relation Age of Onset   Hypertension Father    Outpatient Medications Prior to Visit  Medication Sig Dispense Refill   acetaminophen  (TYLENOL ) 500 MG tablet Take 500 mg by mouth every 6 (six) hours as needed for mild pain.     meloxicam  (MOBIC ) 7.5 MG tablet Take 1 tablet  (7.5 mg total) by mouth daily as needed. 30 tablet 2   norethindrone (MICRONOR) 0.35 MG tablet Take 1 tablet by mouth daily.     Prenatal Vit-Fe Fumarate-FA (PRENATAL MULTIVITAMIN) TABS tablet Take 1 tablet by mouth daily at 12 noon.     SUMAtriptan  (IMITREX ) 50 MG tablet Take 1 tablet (50 mg total) by mouth every 2 (two) hours as needed for migraine. May repeat in 2 hours if headache persists or recurs. 9 tablet 0   Semaglutide -Weight Management (WEGOVY ) 0.5 MG/0.5ML SOAJ Inject 0.5 mg into the skin once a week. 2 mL 2   WEGOVY  0.25 MG/0.5ML SOAJ Inject 0.25 mg into the skin once a week. Use this dose for 1 month (4 shots) and then increase to next higher dose. (Patient not taking: Reported on 03/10/2024) 2 mL 0   No facility-administered medications prior to visit.   Allergies  Allergen Reactions   Morphine Itching   Morphine And Codeine Itching     ROS: A complete ROS was performed with pertinent positives/negatives noted in the HPI. The remainder of the ROS are negative.    Objective:   Today's Vitals   03/10/24 0919  BP: 121/76  Pulse: 70  SpO2: 100%  Weight: 207 lb (93.9 kg)  Height: 5\' 2"  (1.575 m)    Physical Exam          GENERAL: Well-appearing, in NAD. Well nourished.  SKIN: Pink, warm and dry.  Head: Normocephalic. NECK: Trachea midline. Full ROM w/o pain or  tenderness.  RESPIRATORY: Chest wall symmetrical. Respirations even and non-labored.  MSK: Muscle tone and strength appropriate for age.  NEUROLOGIC: No motor or sensory deficits. Steady, even gait. C2-C12 intact.  PSYCH/MENTAL STATUS: Alert, oriented x 3. Cooperative, appropriate mood and affect.     Assessment & Plan:  1. Prediabetes (Primary) 2. Obesity (BMI 30-39.9) Discussed insurance coverage regarding of weight loss medications and patient states she would still like to try to get Wegovy  approved through insurance. Wegovy  sent in. Discussed alternatives such as Metformin, Zepbound, and referral to  [facility]. We will see if wegovy  is approved with insurance first. Discussed dietary changes, exercise and comorbidity of PCOS.  - Semaglutide -Weight Management 0.25 MG/0.5ML SOAJ; Inject 0.25 mg into the skin once a week.  Dispense: 2 mL; Refill: 0 - Hemoglobin A1c   Meds ordered this encounter  Medications   Semaglutide -Weight Management 0.25 MG/0.5ML SOAJ    Sig: Inject 0.25 mg into the skin once a week.    Dispense:  2 mL    Refill:  0    Supervising Provider:   DE Peru, RAYMOND J [[id number]]   Lab Orders         Hemoglobin A1c      Return for as scheduled..    Patient to reach out to office if new, worrisome, or unresolved symptoms arise or if no improvement in patient's condition. Patient verbalized understanding and is agreeable to treatment plan. All questions answered to patient's satisfaction.    Nonda Bays, [place]

## 2024-03-11 ENCOUNTER — Ambulatory Visit (HOSPITAL_BASED_OUTPATIENT_CLINIC_OR_DEPARTMENT_OTHER): Payer: Self-pay | Admitting: Family Medicine

## 2024-03-11 NOTE — Progress Notes (Signed)
 Hi Christine Alexander Your A1c is stable and still out of the prediabetic range at 5.5.  It is gone up slightly from 5.3.  As discussed, we will see if the Wegovy  gets approved and if not consider possibly the metformin.

## 2024-03-14 ENCOUNTER — Encounter (HOSPITAL_BASED_OUTPATIENT_CLINIC_OR_DEPARTMENT_OTHER): Payer: Self-pay | Admitting: Family Medicine

## 2024-03-14 NOTE — Telephone Encounter (Signed)
 Attempted to do a PA for the [employer]  but received a message that the PA was denied.  Tried to see if an appeal could be done but then received this message:   Your appeal has been n/a  Canceled. Please note that this Drug does not quality for additional review at this time due to medication being a non covered benefit. NBD does not eligible for PCR review.  Routing to Nik[place]ki for review and to see what she advises.

## 2024-03-14 NOTE — Telephone Encounter (Signed)
 Prior authorization for [facility] was received and has been initiated. Pt made aware in separate encounter. Will close this encounter and follow up on the prior auth in the separate encounter.

## 2024-03-15 NOTE — Telephone Encounter (Signed)
 Christine Alexander, please see mychart message sent by pt about seeing if other options could be prescribed since the PA for [employer]  was denied.

## 2024-04-25 ENCOUNTER — Ambulatory Visit (INDEPENDENT_AMBULATORY_CARE_PROVIDER_SITE_OTHER): Payer: Medicaid Other | Admitting: Family Medicine

## 2024-04-25 ENCOUNTER — Encounter (HOSPITAL_BASED_OUTPATIENT_CLINIC_OR_DEPARTMENT_OTHER): Payer: Self-pay | Admitting: Family Medicine

## 2024-04-25 VITALS — BP 113/77 | HR 79 | Ht 62.0 in | Wt 206.0 lb

## 2024-04-25 DIAGNOSIS — E669 Obesity, unspecified: Secondary | ICD-10-CM | POA: Diagnosis not present

## 2024-04-25 DIAGNOSIS — R7303 Prediabetes: Secondary | ICD-10-CM | POA: Diagnosis not present

## 2024-04-25 NOTE — Progress Notes (Signed)
 Subjective:   Christine Alexander [birth year] 04/25/2024  Chief Complaint  Patient presents with   Medical Management of Chronic Issues    59-month follow up for weight; denies any main concerns for today's visit.    HPI: Christine Alexander presents today for re-assessment and management of chronic medical conditions.  IMPAIRED FASTING GLUCOSE Christine Alexander is here for medical management of impaired fasting glucose.  Patient's current IFG medication regimen is: compounded tirzepatide (Liposlim) 25 units (equivalent to 0.25mg ) weekly x4 weeks  Adhering to a diabetic diet: low carb, 80oz water daily  Exercising Regularly: has started going to the gym intermittently, states not on a regular basis  Checking Blood Sugars: No  Denies polydipsia, polyphagia, polyuria.  She reports 4lb weight loss according to her scale at home.  Lab Results  Component Value Date   HGBA1C 5.5 03/10/2024    The following portions of the patient's history were reviewed and updated as appropriate: past medical history, past surgical history, family history, social history, allergies, medications, and problem list.   Patient Active Problem List   Diagnosis Date Noted   Obesity (BMI 30-39.9) 10/27/2023   Elevated LFTs 10/27/2023   Prediabetes 10/27/2023   Class 3 obesity 07/30/2023   Wellness examination 07/30/2023   Hirsutism 10/08/2021   Polycystic ovaries 10/08/2021   Depression, recurrent (HCC) 10/08/2021   Cholestasis 03/07/2020   Chronic idiopathic constipation 01/12/2019   Past Medical History:  Diagnosis Date   Cholestasis during pregnancy    Constipation    Medical history non-contributory    Polycystic ovaries    Past Surgical History:  Procedure Laterality Date   CHOLECYSTECTOMY  2015   Family History  Problem Relation Age of Onset   Hypertension Father    Outpatient Medications Prior to Visit  Medication Sig Dispense Refill   acetaminophen  (TYLENOL ) 500 MG tablet Take 500 mg by mouth  every 6 (six) hours as needed for mild pain.     meloxicam  (MOBIC ) 7.5 MG tablet Take 1 tablet (7.5 mg total) by mouth daily as needed. 30 tablet 2   norethindrone (MICRONOR) 0.35 MG tablet Take 1 tablet by mouth daily.     Prenatal Vit-Fe Fumarate-FA (PRENATAL MULTIVITAMIN) TABS tablet Take 1 tablet by mouth daily at 12 noon.     SUMAtriptan  (IMITREX ) 50 MG tablet Take 1 tablet (50 mg total) by mouth every 2 (two) hours as needed for migraine. May repeat in 2 hours if headache persists or recurs. 9 tablet 0   TIRZEPATIDE [place] Inject 25 Units into the skin every 7 (seven) days. Liposlim Compound through [facility]. Increase by 25 units every 4 weeks. Started 03/18/2024.     Semaglutide -Weight Management 0.25 MG/0.5ML SOAJ Inject 0.25 mg into the skin once a week. 2 mL 0   No facility-administered medications prior to visit.   Allergies  Allergen Reactions   Morphine Itching   Morphine And Codeine Itching     ROS: A complete ROS was performed with pertinent positives/negatives noted in the HPI. The remainder of the ROS are negative.    Objective:   Today's Vitals   04/25/24 0853  BP: 113/77  Pulse: 79  SpO2: 99%  Weight: 206 lb (93.4 kg)  Height: 5' 2 (1.575 m)    GENERAL: Well-appearing, in NAD. Well nourished.  SKIN: Pink, warm and dry.  Head: Normocephalic. NECK: Trachea midline. Full ROM w/o pain or tenderness.  EYES: Conjunctiva clear without exudates. EOMI, PERRL, no drainage present.  THROAT: Uvula  midline. Oropharynx clear. Mucous membranes pink and moist.  RESPIRATORY: Chest wall symmetrical. Respirations even and non-labored. Breath sounds clear to auscultation bilaterally.  CARDIAC: S1, S2 present, regular rate and rhythm without murmur or gallops. Peripheral pulses 2+ bilaterally.  MSK: Muscle tone and strength appropriate for age. Joints w/o tenderness, redness, or swelling.  EXTREMITIES: Without clubbing, cyanosis, or edema.  NEUROLOGIC: No motor  or sensory deficits. Steady, even gait. C2-C12 intact.  PSYCH/MENTAL STATUS: Alert, oriented x 3. Cooperative, appropriate mood and affect.   Health Maintenance Due  Topic Date Due   COVID-19 Vaccine (3 - Mixed Product risk series) 03/14/2020    No results found for any visits on 04/25/24.  The ASCVD Risk score (Arnett DK, et al., 2019) failed to calculate for the following reasons:   The 2019 ASCVD risk score is only valid for ages 58 to 4     Assessment & Plan:  1. Prediabetes (Primary) 2. Obesity (BMI 30-39.9) Discussed increasing tirzepatide 25 units every 4 weeks. Pt verbalized understanding, states she is ready to move up to 50 units (equivalent to 0.5mg ). Encouraged pt to continue low carb diet, increasing water intake, and incorporating fiber and protein into diet. Pt declined offer to check hepatic function today, states she would like to wait for AE to have labs drawn.   No orders of the defined types were placed in this encounter.  Lab Orders  No laboratory test(s) ordered today   No images are attached to the encounter or orders placed in the encounter.  Return in about 6 months (around 10/26/2024) for Annual Physical, IFG .    Patient to reach out to office if new, worrisome, or unresolved symptoms arise or if no improvement in patient's condition. Patient verbalized understanding and is agreeable to treatment plan. All questions answered to patient's satisfaction.   Thersia Stark, FNP-C

## 2024-04-25 NOTE — Progress Notes (Deleted)
 Subjective:   Christine Alexander [birth year] 04/25/2024  Chief Complaint  Patient presents with   Medical Management of Chronic Issues    32-month follow up for weight; denies any main concerns for today's visit.    HPI: Christine Alexander presents today for re-assessment and management of chronic medical conditions.    The following portions of the patient's history were reviewed and updated as appropriate: past medical history, past surgical history, family history, social history, allergies, medications, and problem list.   Patient Active Problem List   Diagnosis Date Noted   Obesity (BMI 30-39.9) 10/27/2023   Elevated LFTs 10/27/2023   Prediabetes 10/27/2023   Class 3 obesity 07/30/2023   Wellness examination 07/30/2023   Hirsutism 10/08/2021   Polycystic ovaries 10/08/2021   Depression, recurrent (HCC) 10/08/2021   Cholestasis 03/07/2020   Chronic idiopathic constipation 01/12/2019   Past Medical History:  Diagnosis Date   Cholestasis during pregnancy    Constipation    Medical history non-contributory    Polycystic ovaries    Past Surgical History:  Procedure Laterality Date   CHOLECYSTECTOMY  2015   Family History  Problem Relation Age of Onset   Hypertension Father    Outpatient Medications Prior to Visit  Medication Sig Dispense Refill   acetaminophen  (TYLENOL ) 500 MG tablet Take 500 mg by mouth every 6 (six) hours as needed for mild pain.     meloxicam  (MOBIC ) 7.5 MG tablet Take 1 tablet (7.5 mg total) by mouth daily as needed. 30 tablet 2   norethindrone (MICRONOR) 0.35 MG tablet Take 1 tablet by mouth daily.     Prenatal Vit-Fe Fumarate-FA (PRENATAL MULTIVITAMIN) TABS tablet Take 1 tablet by mouth daily at 12 noon.     Semaglutide -Weight Management 0.25 MG/0.5ML SOAJ Inject 0.25 mg into the skin once a week. 2 mL 0   SUMAtriptan  (IMITREX ) 50 MG tablet Take 1 tablet (50 mg total) by mouth every 2 (two) hours as needed for migraine. May repeat in 2 hours if  headache persists or recurs. 9 tablet 0   No facility-administered medications prior to visit.   Allergies  Allergen Reactions   Morphine Itching   Morphine And Codeine Itching     ROS: A complete ROS was performed with pertinent positives/negatives noted in the HPI. The remainder of the ROS are negative.    Objective:   Today's Vitals   04/25/24 0853  BP: 113/77  Pulse: 79  SpO2: 99%  Weight: 206 lb (93.4 kg)  Height: 5' 2 (1.575 m)    Physical Exam          GENERAL: Well-appearing, in NAD. Well nourished.  SKIN: Pink, warm and dry. No rash, lesion, ulceration, or ecchymoses.  Head: Normocephalic. NECK: Trachea midline. Full ROM w/o pain or tenderness. No lymphadenopathy.  EARS: Tympanic membranes are intact, translucent without bulging and without drainage. Appropriate landmarks visualized.  EYES: Conjunctiva clear without exudates. EOMI, PERRL, no drainage present.  NOSE: Septum midline w/o deformity. Nares patent, mucosa pink and non-inflamed w/o drainage. No sinus tenderness.  THROAT: Uvula midline. Oropharynx clear. Tonsils non-inflamed without exudate. Mucous membranes pink and moist.  RESPIRATORY: Chest wall symmetrical. Respirations even and non-labored. Breath sounds clear to auscultation bilaterally.  CARDIAC: S1, S2 present, regular rate and rhythm without murmur or gallops. Peripheral pulses 2+ bilaterally.  MSK: Muscle tone and strength appropriate for age. Joints w/o tenderness, redness, or swelling.  EXTREMITIES: Without clubbing, cyanosis, or edema.  NEUROLOGIC: No motor or sensory deficits. Steady,  even gait. C2-C12 intact.  PSYCH/MENTAL STATUS: Alert, oriented x 3. Cooperative, appropriate mood and affect.   Health Maintenance Due  Topic Date Due   COVID-19 Vaccine (3 - Mixed Product risk series) 03/14/2020    No results found for any visits on 04/25/24.  The ASCVD Risk score (Arnett DK, et al., 2019) failed to calculate for the following  reasons:   The 2019 ASCVD risk score is only valid for ages 98 to 56     Assessment & Plan:  *** There are no diagnoses linked to this encounter.  No orders of the defined types were placed in this encounter.  Lab Orders  No laboratory test(s) ordered today   No images are attached to the encounter or orders placed in the encounter.  No follow-ups on file.    Patient to reach out to office if new, worrisome, or unresolved symptoms arise or if no improvement in patient's condition. Patient verbalized understanding and is agreeable to treatment plan. All questions answered to patient's satisfaction.    Thersia Schuyler Stark, [place]

## 2024-04-25 NOTE — Progress Notes (Signed)
 Subjective:   Christine Alexander 1996/07/12 04/25/2024  Chief Complaint  Patient presents with   Medical Management of Chronic Issues    47-month follow up for weight; denies any main concerns for today's visit.    HPI: Christine Alexander presents today for re-assessment and management of chronic medical conditions.  IMPAIRED FASTING GLUCOSE Christine Alexander is here for medical management of impaired fasting glucose.  Patient's current IFG medication regimen is: compounded semaglutide  25 units (equivalent to 0.25mg ) weekly x4 weeks  Adhering to a diabetic diet: low carb, 80oz water daily  Exercising Regularly: has started going to the gym intermittently, states not on a regular basis  Checking Blood Sugars: No  Denies polydipsia, polyphagia, polyuria.  She reports 4lb weight loss according to her scale at home.  Lab Results  Component Value Date   HGBA1C 5.5 03/10/2024    The following portions of the patient's history were reviewed and updated as appropriate: past medical history, past surgical history, family history, social history, allergies, medications, and problem list.   Patient Active Problem List   Diagnosis Date Noted   Obesity (BMI 30-39.9) 10/27/2023   Elevated LFTs 10/27/2023   Prediabetes 10/27/2023   Class 3 obesity 07/30/2023   Wellness examination 07/30/2023   Hirsutism 10/08/2021   Polycystic ovaries 10/08/2021   Depression, recurrent (HCC) 10/08/2021   Cholestasis 03/07/2020   Chronic idiopathic constipation 01/12/2019   Past Medical History:  Diagnosis Date   Cholestasis during pregnancy    Constipation    Medical history non-contributory    Polycystic ovaries    Past Surgical History:  Procedure Laterality Date   CHOLECYSTECTOMY  2015   Family History  Problem Relation Age of Onset   Hypertension Father    Outpatient Medications Prior to Visit  Medication Sig Dispense Refill   acetaminophen  (TYLENOL ) 500 MG tablet Take 500 mg by mouth every 6 (six)  hours as needed for mild pain.     meloxicam  (MOBIC ) 7.5 MG tablet Take 1 tablet (7.5 mg total) by mouth daily as needed. 30 tablet 2   norethindrone (MICRONOR) 0.35 MG tablet Take 1 tablet by mouth daily.     Prenatal Vit-Fe Fumarate-FA (PRENATAL MULTIVITAMIN) TABS tablet Take 1 tablet by mouth daily at 12 noon.     Semaglutide -Weight Management 0.25 MG/0.5ML SOAJ Inject 0.25 mg into the skin once a week. 2 mL 0   SUMAtriptan  (IMITREX ) 50 MG tablet Take 1 tablet (50 mg total) by mouth every 2 (two) hours as needed for migraine. May repeat in 2 hours if headache persists or recurs. 9 tablet 0   No facility-administered medications prior to visit.   Allergies  Allergen Reactions   Morphine Itching   Morphine And Codeine Itching     ROS: A complete ROS was performed with pertinent positives/negatives noted in the HPI. The remainder of the ROS are negative.    Objective:   Today's Vitals   04/25/24 0853  BP: 113/77  Pulse: 79  SpO2: 99%  Weight: 93.4 kg  Height: 5' 2 (1.575 m)    GENERAL: Well-appearing, in NAD. Well nourished.  SKIN: Pink, warm and dry.  Head: Normocephalic. NECK: Trachea midline. Full ROM w/o pain or tenderness.  EYES: Conjunctiva clear without exudates. EOMI, PERRL, no drainage present.  THROAT: Uvula midline. Oropharynx clear. Mucous membranes pink and moist.  RESPIRATORY: Chest wall symmetrical. Respirations even and non-labored. Breath sounds clear to auscultation bilaterally.  CARDIAC: S1, S2 present, regular rate and rhythm without murmur  or gallops. Peripheral pulses 2+ bilaterally.  MSK: Muscle tone and strength appropriate for age. Joints w/o tenderness, redness, or swelling.  EXTREMITIES: Without clubbing, cyanosis, or edema.  NEUROLOGIC: No motor or sensory deficits. Steady, even gait. C2-C12 intact.  PSYCH/MENTAL STATUS: Alert, oriented x 3. Cooperative, appropriate mood and affect.   Health Maintenance Due  Topic Date Due   COVID-19 Vaccine (3  - Mixed Product risk series) 03/14/2020    No results found for any visits on 04/25/24.  The ASCVD Risk score (Arnett DK, et al., 2019) failed to calculate for the following reasons:   The 2019 ASCVD risk score is only valid for ages 32 to 45     Assessment & Plan:  1. Prediabetes (Primary) 2. Obesity (BMI 30-39.9) Discussed increasing semaglutide  by 12-25 units every 4 weeks. Pt verbalized understanding, states she is ready to move up to 50 units (equivalent to 0.5mg ). Encouraged pt to continue low carb diet, increasing water intake, and incorporating fiber and protein into diet. Pt declined offer to check hepatic function today, states she would like to wait for AE to have labs drawn.   No orders of the defined types were placed in this encounter.  Lab Orders  No laboratory test(s) ordered today   No images are attached to the encounter or orders placed in the encounter.  Return in about 6 months (around 10/26/2024) for Annual Physical, IFG .    Patient to reach out to office if new, worrisome, or unresolved symptoms arise or if no improvement in patient's condition. Patient verbalized understanding and is agreeable to treatment plan. All questions answered to patient's satisfaction.   Treatment plan and recommendation(s) reviewed by supervising preceptor, Thersia CLEMENTEEN Stark, FNP-C, prior to clinic discharge.   Rosina Ada, BSN, RN  DNP Student

## 2024-07-21 ENCOUNTER — Other Ambulatory Visit: Payer: Self-pay | Admitting: Orthopedic Surgery

## 2024-07-21 ENCOUNTER — Other Ambulatory Visit: Payer: Self-pay

## 2024-07-21 DIAGNOSIS — M67431 Ganglion, right wrist: Secondary | ICD-10-CM | POA: Diagnosis not present

## 2024-07-21 NOTE — Progress Notes (Signed)
 Orthopaedic Surgery Hand and Upper Extremity History and Physical Examination  CC: Follow-up right wrist ganglion cyst  HPI 07/21/2024: Jennier Schissler is a 28 y.o. female presents for follow-up evaluation of right wrist ganglion cyst.  States that the cyst has been present since 2021 without any inciting event or injury.  She was seen by this previously in 2023 however at the time she was pregnant and the wrist was not bothersome so she continued to watch it.  She states that now the wrist has become bothersome and painful particularly when she is trying to exercise or do any repetitive activity.  She states that she also wears her watch on that hand and it will frequently compress the cyst which is uncomfortable as well.   Problem List:  Problem List[1]  Past Medical History: Medical History[2]   Medications: Current Rx ordered in [facility][3]  Allergies: Allergies as of 07/21/2024 - Reviewed 07/21/2024  Allergen Reaction Noted  . Morphine Itching 07/31/2021  . Codeine Itching 08/17/2015    Past Surgical History: Surgical History[4]   Social History: Social History   Occupational History  . Not on file  Tobacco Use  . Smoking status: Never  . Smokeless tobacco: Never  Vaping Use  . Vaping status: Never Used  Substance and Sexual Activity  . Alcohol use: Never  . Drug use: Never  . Sexual activity: Not on file     Family History: Family History[5] Otherwise, no relevant orthopaedic family history  ROS: Review of Systems: All systems reviewed and are negative except that mentioned in HPI  Work/Sport/Hobbies: See HPI  Physical Examination: Vitals:   07/21/24 0908  BP: 111/66  Pulse: 85  Resp: 18  Temp: 97.9 F (36.6 C)  SpO2: 100%   Constitutional: Awake, alert.  WN/WD Appearance: healthy, no acute distress, well-groomed Affect: Normal HEENT: EOMI, mucous membranes moist CV: RRR Pulm: breathing comfortably  Right upper Extremity / Hand Overlying skin  is warm dry and intact.  There is no signs of rash irritation or infection.  There is no obvious deformity.  No obvious nail deformities.  Capillary refill is brisk and skin turgor is appropriate.  Neurovascularly intact.  Can make a composite fist when compared bilaterally.    There is a grape sized dorsal ganglion cyst at the wrist.  Sensation: intact to light touch in median, radial and ulnar nerve distributions  Motor: intact in AIN, PIN, and ulnar nerve distributions Vascular: Fingers warm and well perfused, palpable radial pulse   Assessment/Plan: 1.  Right wrist dorsal ganglion cyst: Chronic, now painful and bothersome - Discussed the nature of ganglion cysts. We discussed treatment options for ganglions including acceptance, splinting, aspiration, and surgical excision. This decision was made today to move forward with surgery. We had an extensive conversation about what to expect during the pre op, intra op, and post op periods. Risks, benefits, and alternatives of surgery were discussed including risks of blood loss, infection, damage to nerves/vessels/tendons/ligaments/bone, failure of surgery, need for additional surgery, complications with wound healing, stiffness, continued pain.  She voiced understanding of these risks and elected to proceed.  We will work on having this arranged.  She agrees with the plan of care. The patient is acceptable for ambulatory surgery center.   Follow up 1 week post-op.   Betty Anton, PA-C [facility] Department of Orthopaedic Surgery 07/21/2024 9:23 AM      [1] Patient Active Problem List Diagnosis  . Ganglion cyst of dorsum of right  wrist  [2] History reviewed. No pertinent past medical history. [3] Meds Ordered in [facility]  Medication Sig Dispense Refill  . norethindrone (Jencycla) 0.35 mg tab Take 1 tablet by mouth daily.    . mometasone (NASONEX) 50 mcg/actuation spry spray Administer 2 sprays into affected  nostril(s) Once Daily. (Patient not taking: Reported on 07/21/2024) 17 g 0   No current Epic-ordered facility-administered medications on file.  [4] History reviewed. No pertinent surgical history. [5] Family History Problem Relation Name Age of Onset  . Arthritis Mother

## 2024-07-26 ENCOUNTER — Ambulatory Visit (HOSPITAL_BASED_OUTPATIENT_CLINIC_OR_DEPARTMENT_OTHER): Admission: RE | Admit: 2024-07-26 | Source: Home / Self Care | Admitting: Orthopedic Surgery

## 2024-07-26 DIAGNOSIS — Z01818 Encounter for other preprocedural examination: Secondary | ICD-10-CM

## 2024-07-26 SURGERY — EXCISION, GANGLION CYST, WRIST
Anesthesia: Choice | Site: Wrist | Laterality: Right

## 2024-07-31 ENCOUNTER — Other Ambulatory Visit (HOSPITAL_BASED_OUTPATIENT_CLINIC_OR_DEPARTMENT_OTHER): Payer: Self-pay | Admitting: Family Medicine

## 2024-07-31 DIAGNOSIS — G43009 Migraine without aura, not intractable, without status migrainosus: Secondary | ICD-10-CM

## 2024-10-20 ENCOUNTER — Other Ambulatory Visit (HOSPITAL_BASED_OUTPATIENT_CLINIC_OR_DEPARTMENT_OTHER): Payer: Self-pay
